# Patient Record
Sex: Male | Born: 1960 | Race: White | Hispanic: No | Marital: Married | Smoking: Current every day smoker
Health system: Southern US, Community
[De-identification: ages and names within clinical notes are randomized; demographics above are authoritative.]

## PROBLEM LIST (undated history)

## (undated) DIAGNOSIS — E079 Disorder of thyroid, unspecified: Secondary | ICD-10-CM

## (undated) DIAGNOSIS — M549 Dorsalgia, unspecified: Secondary | ICD-10-CM

## (undated) DIAGNOSIS — H52209 Unspecified astigmatism, unspecified eye: Secondary | ICD-10-CM

## (undated) DIAGNOSIS — E785 Hyperlipidemia, unspecified: Secondary | ICD-10-CM

## (undated) DIAGNOSIS — M1990 Unspecified osteoarthritis, unspecified site: Secondary | ICD-10-CM

## (undated) DIAGNOSIS — I10 Essential (primary) hypertension: Secondary | ICD-10-CM

## (undated) DIAGNOSIS — F419 Anxiety disorder, unspecified: Secondary | ICD-10-CM

## (undated) HISTORY — DX: Unspecified astigmatism, unspecified eye: H52.209

## (undated) HISTORY — DX: Unspecified osteoarthritis, unspecified site: M19.90

## (undated) HISTORY — PX: PLEURAL SCARIFICATION: SHX748

## (undated) HISTORY — PX: BACK SURGERY: SHX140

## (undated) HISTORY — PX: APPENDECTOMY: SHX54

## (undated) HISTORY — DX: Hyperlipidemia, unspecified: E78.5

---

## 2000-02-03 ENCOUNTER — Emergency Department (HOSPITAL_COMMUNITY): Admission: EM | Admit: 2000-02-03 | Discharge: 2000-02-03 | Payer: Self-pay | Admitting: Emergency Medicine

## 2001-11-28 ENCOUNTER — Encounter: Payer: Self-pay | Admitting: Neurosurgery

## 2001-12-02 ENCOUNTER — Encounter: Payer: Self-pay | Admitting: Neurosurgery

## 2001-12-02 ENCOUNTER — Ambulatory Visit (HOSPITAL_COMMUNITY): Admission: RE | Admit: 2001-12-02 | Discharge: 2001-12-02 | Payer: Self-pay | Admitting: Neurosurgery

## 2005-03-17 ENCOUNTER — Inpatient Hospital Stay (HOSPITAL_COMMUNITY): Admission: EM | Admit: 2005-03-17 | Discharge: 2005-03-22 | Payer: Self-pay | Admitting: Emergency Medicine

## 2005-04-05 ENCOUNTER — Encounter (INDEPENDENT_AMBULATORY_CARE_PROVIDER_SITE_OTHER): Payer: Self-pay | Admitting: Specialist

## 2005-04-06 ENCOUNTER — Inpatient Hospital Stay (HOSPITAL_COMMUNITY): Admission: EM | Admit: 2005-04-06 | Discharge: 2005-04-09 | Payer: Self-pay | Admitting: Emergency Medicine

## 2006-05-07 ENCOUNTER — Inpatient Hospital Stay (HOSPITAL_COMMUNITY): Admission: RE | Admit: 2006-05-07 | Discharge: 2006-05-07 | Payer: Self-pay | Admitting: Neurosurgery

## 2006-11-29 ENCOUNTER — Encounter: Admission: RE | Admit: 2006-11-29 | Discharge: 2006-11-29 | Payer: Self-pay | Admitting: Neurosurgery

## 2007-02-07 IMAGING — CT CT ABDOMEN W/ CM
2 of 5 series · 17 of 46 positions shown, 19 images · IV contrast (omnipaque)
Comparison: 03/17/2005

ABDOMEN CT WITH CONTRAST

CLINICAL DATA: Appendicitis. Status post abscess drain.
TECHNIQUE: Multidetector CT imaging of the abdomen and pelvis was performed
following the standard protocol during bolus administration of intravenous
contrast.

Contrast:  125 cc Omnipaque 300

[Series 2: abd_pel 5.0 b40f st · axial · 0.80mm/px · z∈[-529,-59]mm · 14 of 106 slices shown, 16 images]
[im 6/106  soft-tissue]
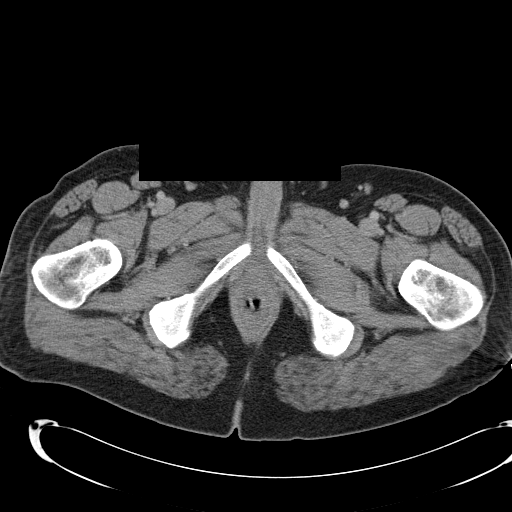
[im 6/106  bone]
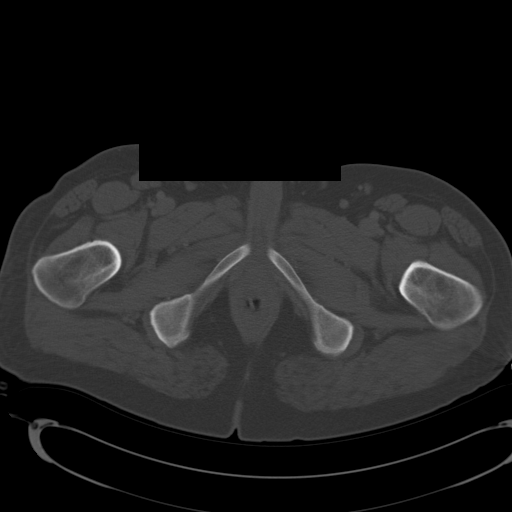
[im 12/106  soft-tissue]
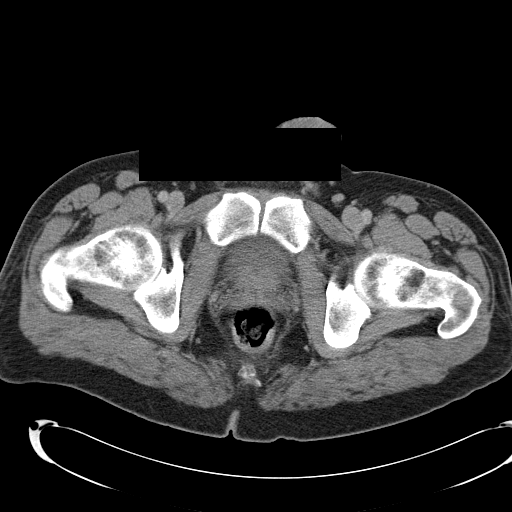
[im 23/106  soft-tissue]
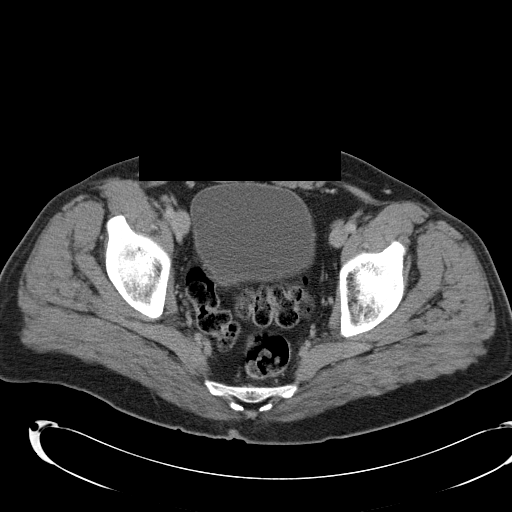
[im 28/106  soft-tissue]
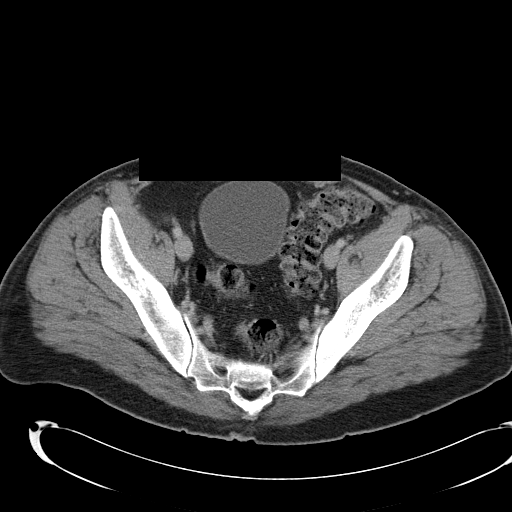
[im 34/106  soft-tissue]
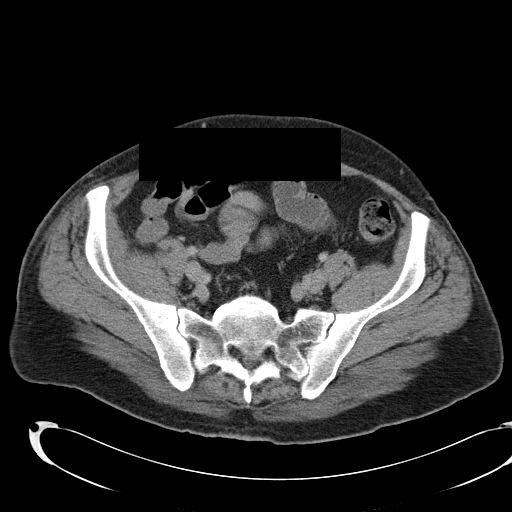
[im 45/106  soft-tissue]
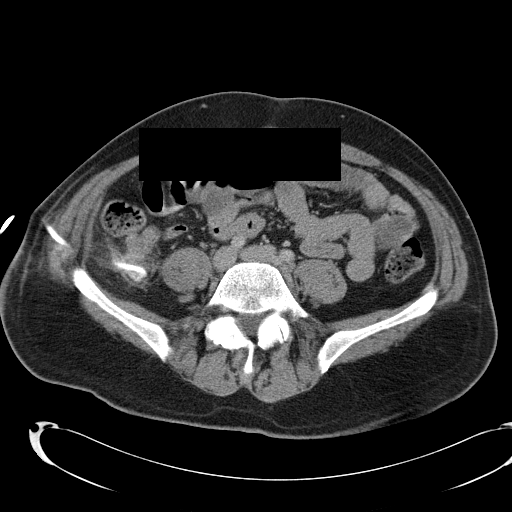
[im 50/106  soft-tissue]
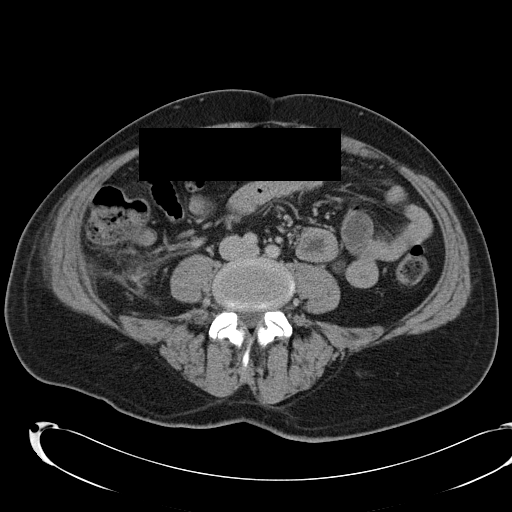
[im 56/106  soft-tissue]
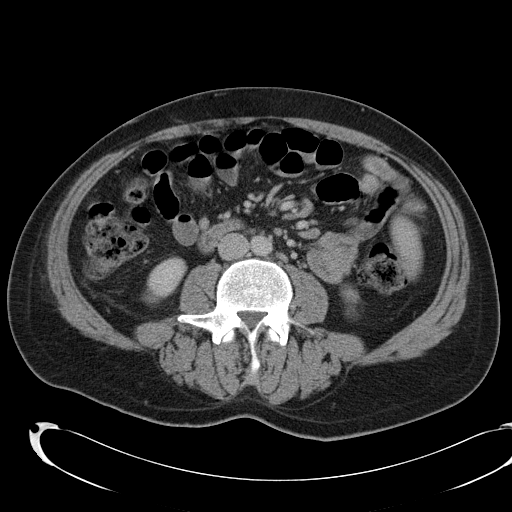
[im 61/106  soft-tissue]
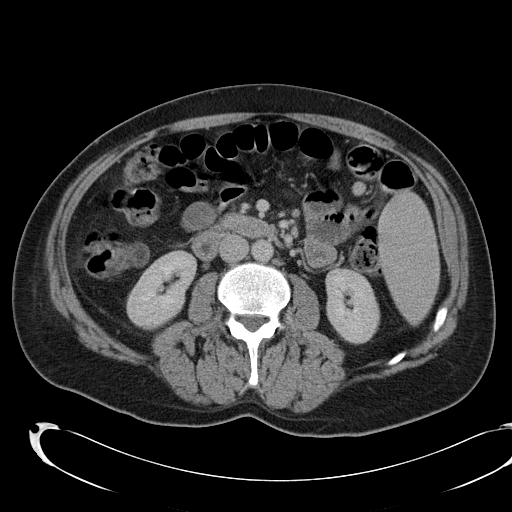
[im 61/106  bone]
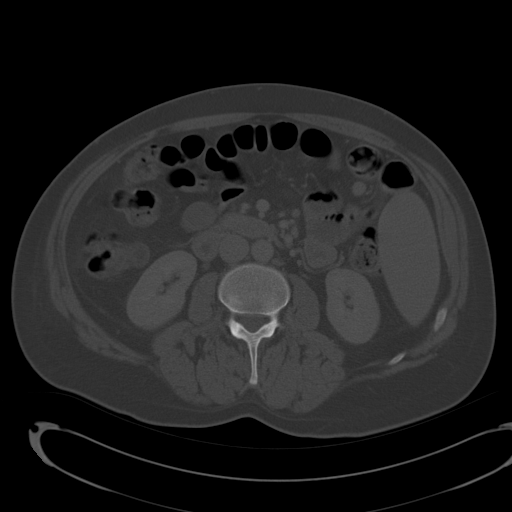
[im 72/106  soft-tissue]
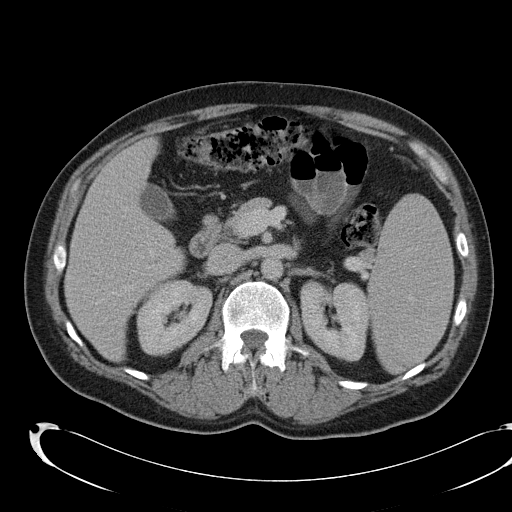
[im 78/106  soft-tissue]
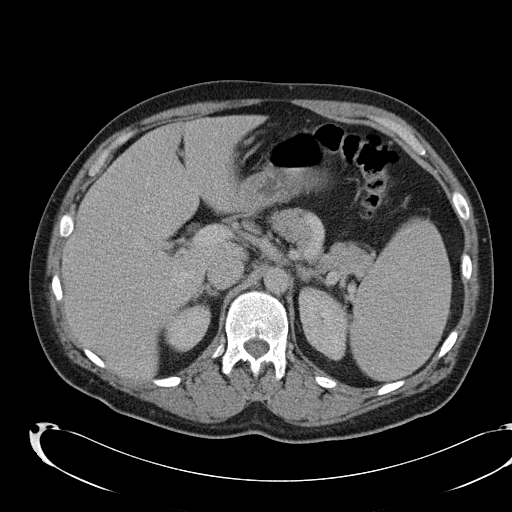
[im 83/106  soft-tissue]
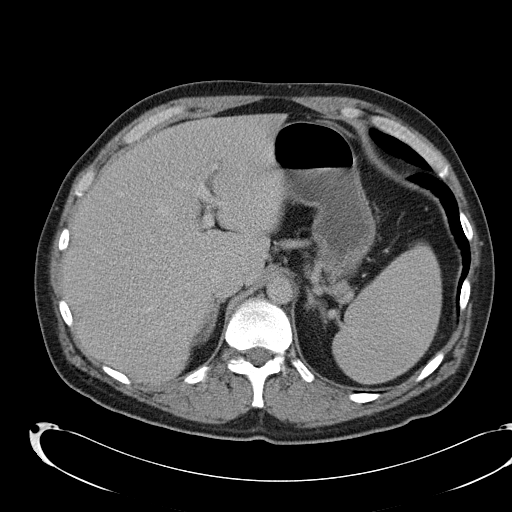
[im 94/106  soft-tissue]
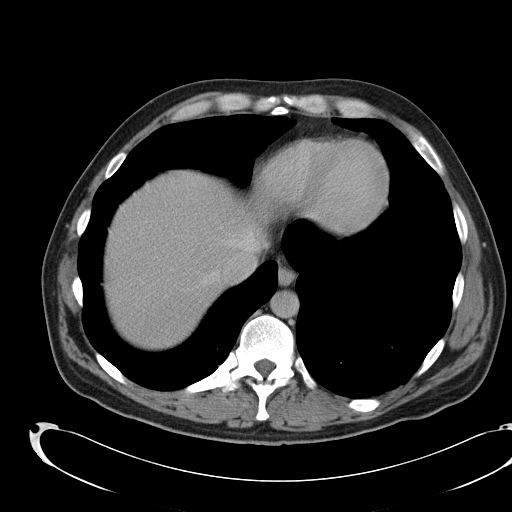
[im 100/106  soft-tissue]
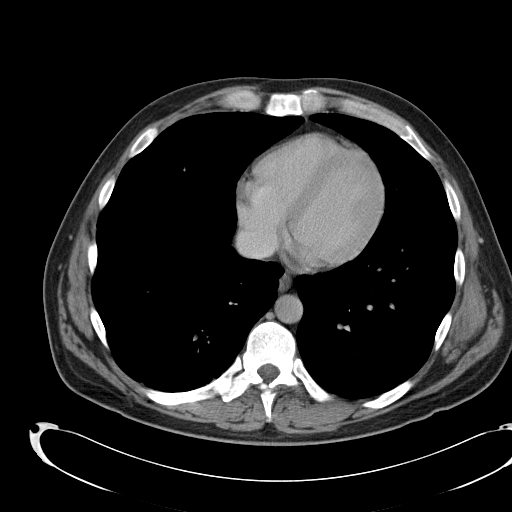

[Series 602: coronal · coronal · 1.06mm/px · 3 of 59 slices shown]
[im 20/59  soft-tissue]
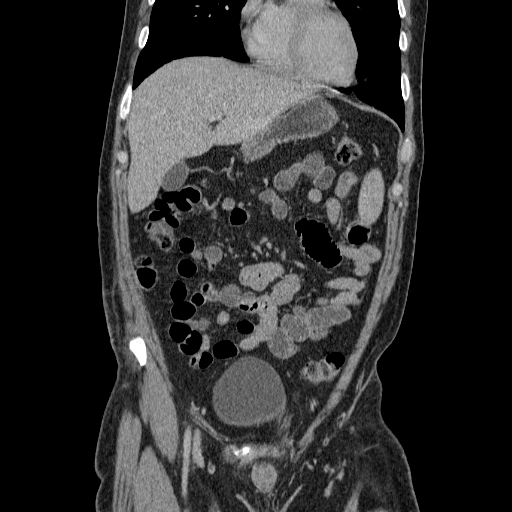
[im 26/59  soft-tissue]
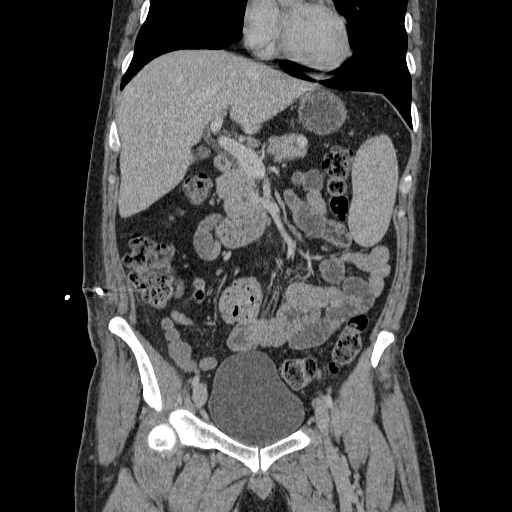
[im 33/59  soft-tissue]
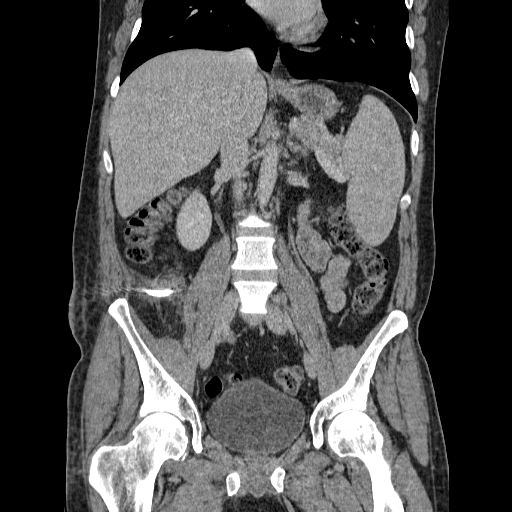

[17 of 46 positions shown; findings below may reference images not displayed]

FINDINGS: The liver, pancreas, adrenals, kidneys unremarkable. Spleen is
enlarged, measuring nearly 18 cm in craniocaudal length. This is stable since
prior study. Bowel grossly unremarkable. Minimal right base atelectasis.
Otherwise lung bases clear.

IMPRESSION

Splenomegaly

PELVIS CT WITH CONTRAST
FINDINGS: Right lower quadrant drain remains in place. There is essentially
complete resolution of the right lower quadrant abscess with no identifiable
fluid remaining.

Bowel grossly unremarkable. No free fluid, free air, or adenopathy.

IMPRESSION

Complete resolution of the right lower quadrant abscess with right lower
quadrant drain remaining in place.

## 2009-07-10 ENCOUNTER — Emergency Department (HOSPITAL_COMMUNITY): Admission: EM | Admit: 2009-07-10 | Discharge: 2009-07-10 | Payer: Self-pay | Admitting: Emergency Medicine

## 2010-07-01 NOTE — H&P (Signed)
NAMEKAICEN, DESENA                ACCOUNT NO.:  [id number]   MEDICAL RECORD NO.:  [id number]          PATIENT TYPE:  EMS   LOCATION:  ED                           FACILITY:  [facility]   PHYSICIAN:  Alfonse Ras, MD   DATE OF BIRTH:  10/11/1960   DATE OF ADMISSION:  03/17/2005  DATE OF DISCHARGE:                                HISTORY & PHYSICAL   A 50 year old white male presented to the emergency room with a one week  history of abdominal pain. Workup here by CAT scan revealed a perforated  appendix with a 4 x 5 cm abscess in the right lower quadrant consistent with  perforated appendicitis.   PAST MEDICAL HISTORY:  None.   PAST SURGICAL HISTORY:  Significant for back surgery only.   MEDICATIONS:  None.   ALLERGIES:  He has no known drug allergies.   PHYSICAL EXAMINATION:  GENERAL:  He is an age appropriate white male in  minimal distress.  VITAL SIGNS:  Temperature is 99.6, blood pressure is 127/65, respiratory  rate is 16.  HEENT:  Benign, normocephalic, atraumatic. Pupils equal round and reactive  to light.  LUNGS:  Clear to auscultation and percussion x2.  HEART:  Regular rate and rhythm without murmurs, rubs or gallops.  ABDOMEN:  Soft but tender with localized rebound in the right lower  quadrant.  EXTREMITIES:  Show no cyanosis, clubbing or edema.   IMPRESSION:  Perforated appendicitis.   PLAN:  Admission, IV antibiotics and percutaneous drainage of the perforated  appendix.      Alfonse Ras, MD  Electronically Signed     KRE/MEDQ  D:  03/17/2005  T:  03/17/2005  Job:  [id number]

## 2010-07-01 NOTE — Discharge Summary (Signed)
[name], [name]                ACCOUNT NO.:  [id number]   MEDICAL RECORD NO.:  [id number]          PATIENT TYPE:  INP   LOCATION:  1519                         FACILITY:  [facility]   PHYSICIAN:  Alfonse Ras, MD   DATE OF BIRTH:  07-03-1960   DATE OF ADMISSION:  03/17/2005  DATE OF DISCHARGE:  03/22/2005                                 DISCHARGE SUMMARY   ADMISSION DIAGNOSIS:  Ruptured appendicitis.   DISCHARGE DIAGNOSES:  Ruptured appendicitis.   CONDITION ON DISCHARGE:  Good and improved.   DISPOSITION:  Patient is discharged to home.   FOLLOW UP:  With me in one week.   DISCHARGE MEDICATIONS:  Flagyl 500 mg b.i.d. x7 days and Cipro 500 mg b.i.d.  x7 days.   HOSPITAL COURSE:  Patient was admitted and on CAT scan was found to have a  perforated appendicitis with a localized abscess.  He was started on IV  antibiotics and had a percutaneous drain placed.  This was left in place and  flushed twice a day.  Patient  remained on IV Unasyn for four days.  He  became afebrile after the first 36 hours, was tolerating a regular diet, and  on repeat CAT scan on hospital day #4, the size of the abscess was  decreased, and the drain was removed.  The patient was ready for discharge  and will be scheduled for followup with me in three weeks.   PLAN:  Follow up with me in three weeks and schedule for interval  appendectomy.      Alfonse Ras, MD  Electronically Signed     KRE/MEDQ  D:  03/22/2005  T:  03/22/2005  Job:  [phone] [id number]

## 2010-07-01 NOTE — Op Note (Signed)
NAMEDANH, BAYUS                ACCOUNT NO.:  [id number]   MEDICAL RECORD NO.:  [id number]          PATIENT TYPE:  INP   LOCATION:  3012                         FACILITY:  [facility]   PHYSICIAN:  Hewitt Shorts, M.D.DATE OF BIRTH:  1960-07-15   DATE OF PROCEDURE:  05/07/2006  DATE OF DISCHARGE:                               OPERATIVE REPORT   PREOPERATIVE DIAGNOSES:  1. Recurrent left L5-S1 lumbar disk herniation.  2. Lumbar spondylosis.  3. Lumbar degenerative disease.  4. Lumbar radiculopathy.   POSTOPERATIVE DIAGNOSES:  1. Recurrent left L5-S1 lumbar disk herniation.  2. Lumbar spondylosis.  3. Lumbar degenerative disease.  4. Lumbar radiculopathy.   PROCEDURE:  Left L5-S1 lumbar laminotomy and microdiskectomy with  microdissection.   SURGEON:  Hewitt Shorts, M.D.   ASSISTANT:  Webb Silversmith, RN and Venetia Maxon.   ANESTHESIA:  General endotracheal anesthesia.   INDICATIONS:  The patient is a 50 year old man who presented with  recurrent left lumbar radiculopathy.  He is here status post a previous  left L5-S1 lumbar diskectomy from which he did well.  Decision was made  to proceed with elective laminotomy and microdiskectomy.   PROCEDURE:  The patient was brought to the operating room, placed under  general endotracheal anesthesia.  The patient was turned to a prone  position.  The lumbar region was prepped with Betadine saline solution  and draped in sterile fashion.  The previous midline incision was  reopened.  The incision was infiltrated with local anesthetic with  epinephrine.  Dissection was carried down to the subcutaneous tissue to  the lumbar fascia, which was incised from the left side of the midline  and the paraspinal muscles were dissected off the spinous process and  lamina in a subperiosteal fashion.  The left L5-S1 interlaminar space  was identified and x-ray was taken to confirm the localization.  Then  the microscope was draped and brought into the field  to provide  additional magnification, illumination and visualization and the  remainder of the decompression was performed using microdissection,  microsurgical technique.  The scar tissue was dissected from the edge of  the previous laminotomy and the laminotomy was extended slightly.  We  found what appeared to be a small to moderate sized synovial cyst  emanating from the left L5-S1 facet joint.  This was carefully dissected  free from the thecal sac and left S1 nerve root and removed.  We then  further dissected in the epidural space and then were able to gently  mobilize the left S1 nerve root and thecal sac medially.  This exposed a  free fragment disk herniation.  This was freed up from the surrounding  epidural tissues and removed in a piecemeal fashion and then we incised  the remaining scar tissue overlying the anulus, incised the anulus and  then entered into the disk space and proceeding with the third  diskectomy using a variety of pituitary rongeurs and micro curets.  All  the loose disk material was removed from both the disk space and the  epidural space and good decompression of thecal sac and  nerve root were  achieved.  Once the diskectomy was completed and hemostasis was  subsequently established with the use of bipolar cautery the wound was  irrigated with bacitracin solution and then we instilled 2 mL of  fentanyl, 80 mg Depo-Medrol into the epidural space and proceeded with  closure.  The deep fascia was closed with interrupted undyed 1 Vicryl.  The Scarpa's fascia was closed with interrupted, inverted undyed 1  Vicryl suture and the subcutaneous subcuticular was closed interrupted  inverted 2-0 undyed Vicryl sutures and the skin edges were approximated  with Dermabond.  The procedure was tolerated well.  Estimated blood loss  was 25 mL.  Sponge count correct.  Following surgery the patient was  turned back to the supine position to be reversed extubated and   transferred to the recovery room for further care.      Hewitt Shorts, M.D.  Electronically Signed     RWN/MEDQ  D:  05/07/2006  T:  05/07/2006  Job:  [id number]

## 2010-07-01 NOTE — Discharge Summary (Signed)
NAMETENG, DECOU                ACCOUNT NO.:  [id number]   MEDICAL RECORD NO.:  [id number]          PATIENT TYPE:  INP   LOCATION:                               FACILITY:  [facility]   PHYSICIAN:  Alfonse Ras, MD   DATE OF BIRTH:  1960-03-11   DATE OF ADMISSION:  04/09/2005  DATE OF DISCHARGE:  04/14/2005                                 DISCHARGE SUMMARY   ADMISSION DIAGNOSIS:  Recurrent acute appendicitis.   DISCHARGE DIAGNOSIS:  Recurrent acute appendicitis.   PROCEDURE:  Laparoscopic appendectomy.   CONDITION ON DISCHARGE:  Good and improved.   FOLLOWUP:  Followup is with me in 2 weeks.   Disposition is discharged to home.   DISPOSITION:  Discharged to home.   HOSPITAL COURSE:  The patient was admitted with recurrent appendicitis and  known to have been ruptured approximately 3 weeks prior.  He was treated at  that time with percutaneous drainage but now presents with worsening  symptoms.  He was taken to the operating room and underwent laparoscopic  appendectomy and was maintained on antibiotics.  His ileus took 2-3 days to  resolve.  His temperature came down to 99.  White count remained stable  at  11,000.  By postoperative #4, he was eating a regular diet, had no nausea or  vomiting. Incision was okay, and he was discharged to home.  Followup was  with me in 2 weeks.      Alfonse Ras, MD  Electronically Signed     KRE/MEDQ  D:  04/18/2005  T:  04/19/2005  Job:  [id number]

## 2010-07-01 NOTE — Op Note (Signed)
[name], [name]                ACCOUNT NO.:  [id number]   MEDICAL RECORD NO.:  [id number]          PATIENT TYPE:  INP   LOCATION:  1610                         [facility]ACILITY:  [facility]   PHYSICIAN:  Alfonse Ras, MD   DATE OF BIRTH:  Jul 05, 1960   DATE OF PROCEDURE:  04/05/2005  DATE OF DISCHARGE:                                 OPERATIVE REPORT   PREOPERATIVE DIAGNOSIS:  Ruptured appendicitis.   POSTOPERATIVE DIAGNOSIS:  Ruptured appendicitis.   PROCEDURE:  Laparoscopic appendectomy.   SURGEON:  Danna Hefty, MD.   ANESTHESIA:  General.   DESCRIPTION OF PROCEDURE:  The patient was taken to the operating room,  placed in a supine position. After adequate general anesthesia was induced,  a Foley catheter was placed and the abdomen was prepped and draped in a  normal sterile fashion. Using a 12 mm Optiview trocar in the right upper  quadrant under direct vision, peritoneal access was obtained.  Pneumoperitoneum was obtained. Additional 12 and 5 mm trocars were placed in  the abdomen. The right lower quadrant was inspected and the cecum was  inspected. The appendix was acutely inflamed and there was an area of  purulence consistent with his previous perforation. After significant  tedious dissection to mobilize the appendix off the right lateral sidewall  of the peritoneum it was delivered up anteriorly. The base of the appendix  was inspected and its junction with the cecum was inspected. At this point,  I used a white load GIA stapling device to transect the base of the appendix  at the cecum. Because of the extensive amount of contamination from the  previous perforation, I opted to place a drain so a 19 Blake drain was  placed through the 5 mm trocar and into the right lower quadrant near the  anastomosis. The staple line was reinforced with Tisseel. The right lower  quadrant was copiously irrigated with 3 liters of saline. The  pneumoperitoneum was then released after the  appendix and placed in the  EndoCatch bag and removed through the 12 mm port. Skin incisions were closed  with subcuticular 4-0 Monocryl. Steri-Strips and sterile dressings were  applied. The patient tolerated the procedure well and went to PACU in good  condition.      Alfonse Ras, MD  Electronically Signed     KRE/MEDQ  D:  04/05/2005  T:  04/07/2005  Job:  [id number]

## 2010-07-01 NOTE — Op Note (Signed)
NAME:  [name], [name]                          ACCOUNT NO.:  [id number]   MEDICAL RECORD NO.:  [id number]                   PATIENT TYPE:  OIB   LOCATION:  NA                                   FACILITY:  [facility]   PHYSICIAN:  Hewitt Shorts, M.D.            DATE OF BIRTH:  October 09, 1960   DATE OF PROCEDURE:  12/02/2001  DATE OF DISCHARGE:                                 OPERATIVE REPORT   PREOPERATIVE DIAGNOSIS:  Left L5-S1 lumbar disc herniation.   POSTOPERATIVE DIAGNOSIS:  Left L5-S1 lumbar disc herniation.   OPERATION:  Left L5-S1 lumbar laminectomy and microdiskectomy.   SURGEON:  Hewitt Shorts, M.D.   ASSISTANT:  Hilda Lias, M.D.   ANESTHESIA:  General endotracheal.   INDICATIONS FOR PROCEDURE:  The patient is a 50 year old man who presented  with left lumbar radiculopathy,  was found on an MRI scan to have left 5-S1  disc herniation and decision was made to proceed with elective laminectomy  and microdissection.   DESCRIPTION OF PROCEDURE:  The patient was brought to the operating room and  placed under general endotracheal anesthesia.  The patient was turned to a  prone position and lumbar region was prepped with Betadine soap and solution  and draped as a sterile fashion.  The midline was infiltrated with local  anesthetic with epinephrine and x-ray was taken.  The L5-S1 level was  identified and the midline incision made, carried down through the  subcutaneous tissue.  Bipolar cautery and electrocautery was used to  maintain hemostasis.  Dissection was carried down to the lumbar fascia which  was incised on the left side of the midline and the paraspinous muscle and  dissection of spinous process and lamina in a subperiosteal fashion.  The L5-  S1 intralaminar space was identified and x-ray was taken to confirm the  localization and then a laminotomy was performed using a Black Max drill and  Kerrison punches.  The microscope was draped and brought into the  field to  provide instant magnification, illumination and visualization and the  remainder of the procedure was performed using microdissection and  microsurgical technique.  Ligamentum flavum was carefully resected and the  underlying thecal sac and left S1 nerve root were identified.  These were  gently retracted medially.  Overlying epidural veins were coagulated and  disc herniation identified.  The remaining annular fibers were incised and  disc fragment removed. We then further entered into the disc space and  removed degenerated disc material.  In the end, all loose fragments of disc  material were removed from both the disc space and the epidural space and  good decompression of the thecal sac and nerve root was achieved.  Hemostasis was established with the use of bipolar cautery and then once  hemostasis was established, we infiltrated 2 cc of Fentanyl and 80 mg of  Depo-Medrol into the epidural space and proceeded  with closure.  The deep  fascia was closed with interrupted undyed 0 Vicryl suture.  The subcutaneous  layers was closed with interrupted inverted undyed 0 Vicryl sutures and the  subcutaneous and subcuticular closed interrupted inverted  2-0 Vicryl sutures.  Skin edges were approximated with Dermabond.  The  patient tolerated the procedure well.  Estimated blood loss was 25 cc.  Sponge and needle count were correct.  Following surgery, the patient was  returned back to a supine position, reversed anesthetic, extubated and  transferred to the recovery room for further care.                                               Hewitt Shorts, M.D.    RWN/MEDQ  D:  12/02/2001  T:  12/02/2001  Job:  [id number]

## 2010-07-01 NOTE — H&P (Signed)
[name], [name]                ACCOUNT NO.:  [id number]   MEDICAL RECORD NO.:  [id number]          PATIENT TYPE:  OBV   LOCATION:  0106                         FACILITY:  [facility]   PHYSICIAN:  Lebron Conners, M.D.   DATE OF BIRTH:  Sep 19, 1960   DATE OF ADMISSION:  04/04/2005  DATE OF DISCHARGE:                                HISTORY & PHYSICAL   CHIEF COMPLAINT:  Abdominal pain.   PRESENT ILLNESS:  The patient is a 50 year old white male who at the first  part of this month was hospitalized and treated by percutaneous drainage of  a periappendiceal abscess with intention of delaying appendectomy for about  six weeks after treatment of the abscess.  He was cared for by Dr. Colin Benton.  He recently has had flu-like symptoms with a little diarrhea and vomiting.  That has a abated but he has now developed recurring right lower quadrant  pain.  He had a temperature of about 101 yesterday.  He came to the  emergency department last night and a CT scan shows some evidence of  continued periappendiceal inflammation with a small amount of fluid present  but no sizable abscess.  There is no evidence of free peritoneal fluid or  other signs of peritonitis.  His white count is 11.6.  He is admitted for  pain control, IV antibiotics, and consideration of appendectomy at this  time.   PAST MEDICAL HISTORY:  He enjoys good health.  He has no serious chronic  ailments.   He has no chronic medicines.   No medicine allergies.   He smokes about a pack of cigarettes a day.  He drinks alcoholic beverages  only rarely.   REVIEW OF SYSTEMS:  In detail is unremarkable.   FAMILY HISTORY AND CHILDHOOD ILLNESSES:  Unremarkable.   PHYSICAL EXAMINATION:  VITAL SIGNS:  Temperature and vital signs as recorded  by nursing staff.  MENTAL STATUS:  Normal.  No acute distress.  The patient gets up and stands  easily.  HEENT:  Unremarkable with no thyroid enlargement.  No thyroid mass.  No neck  mass.  The  mucous membranes moist.  No icterus.  CHEST:  Clear to auscultation and no chest wall tenderness.  HEAT:  Rate and rhythm normal.  No murmur or gallop.  ABDOMEN:  Soft with good bowel sounds.  No hernia.  No mass.  Marked extreme  right lower quadrant tenderness but no rebound present.  EXTREMITIES:  Normal.  LYMPH NODES:  Not enlarged.  SKIN:  No lesions noted.  NEUROLOGICAL:  Grossly normal.   IMPRESSION:  Continued pain due to appendicitis without evidence of  perforation or abscess at this time.   PLAN:  Fairly urgent appendectomy due to continued symptoms.  I will arrange  to do this today or tomorrow or ask my associate Dr. Daphine Deutscher,  who is on call  today, to do the procedure.  The patient agrees to have this done.      Lebron Conners, M.D.  Electronically Signed     WB/MEDQ  D:  04/05/2005  T:  04/05/2005  Job:  [id number] 

## 2011-11-04 ENCOUNTER — Emergency Department (HOSPITAL_COMMUNITY)
Admission: EM | Admit: 2011-11-04 | Discharge: 2011-11-04 | Disposition: A | Discharge status: Home / Self Care | Payer: Self-pay | Attending: Emergency Medicine | Admitting: Emergency Medicine

## 2011-11-04 ENCOUNTER — Emergency Department (HOSPITAL_COMMUNITY): Payer: Self-pay

## 2011-11-04 ENCOUNTER — Encounter (HOSPITAL_COMMUNITY): Payer: Self-pay | Admitting: *Deleted

## 2011-11-04 DIAGNOSIS — F411 Generalized anxiety disorder: Secondary | ICD-10-CM | POA: Insufficient documentation

## 2011-11-04 DIAGNOSIS — M542 Cervicalgia: Secondary | ICD-10-CM | POA: Insufficient documentation

## 2011-11-04 DIAGNOSIS — F17200 Nicotine dependence, unspecified, uncomplicated: Secondary | ICD-10-CM | POA: Insufficient documentation

## 2011-11-04 DIAGNOSIS — I10 Essential (primary) hypertension: Secondary | ICD-10-CM | POA: Insufficient documentation

## 2011-11-04 HISTORY — DX: Disorder of thyroid, unspecified: E07.9

## 2011-11-04 HISTORY — DX: Dorsalgia, unspecified: M54.9

## 2011-11-04 HISTORY — DX: Anxiety disorder, unspecified: F41.9

## 2011-11-04 HISTORY — DX: Essential (primary) hypertension: I10
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
## 2011-11-08 NOTE — ED Provider Notes (Signed)
Medical screening examination/treatment/procedure(s) were performed by non-physician practitioner and as supervising physician I was immediately available for consultation/collaboration.   Somers Y. Jacorian Golaszewski, MD 11/08/11 1346 

## 2013-07-19 ENCOUNTER — Emergency Department (HOSPITAL_COMMUNITY): Payer: Self-pay

## 2013-07-19 ENCOUNTER — Encounter (HOSPITAL_COMMUNITY): Payer: Self-pay | Admitting: Emergency Medicine

## 2013-07-19 ENCOUNTER — Emergency Department (HOSPITAL_COMMUNITY)
Admission: EM | Admit: 2013-07-19 | Discharge: 2013-07-19 | Disposition: A | Discharge status: Home / Self Care | Payer: Self-pay | Attending: Emergency Medicine | Admitting: Emergency Medicine

## 2013-07-19 DIAGNOSIS — Z79899 Other long term (current) drug therapy: Secondary | ICD-10-CM | POA: Insufficient documentation

## 2013-07-19 DIAGNOSIS — Z8639 Personal history of other endocrine, nutritional and metabolic disease: Secondary | ICD-10-CM | POA: Insufficient documentation

## 2013-07-19 DIAGNOSIS — M25569 Pain in unspecified knee: Secondary | ICD-10-CM | POA: Insufficient documentation

## 2013-07-19 DIAGNOSIS — I10 Essential (primary) hypertension: Secondary | ICD-10-CM | POA: Insufficient documentation

## 2013-07-19 DIAGNOSIS — Z862 Personal history of diseases of the blood and blood-forming organs and certain disorders involving the immune mechanism: Secondary | ICD-10-CM | POA: Insufficient documentation

## 2013-07-19 DIAGNOSIS — M25562 Pain in left knee: Secondary | ICD-10-CM

## 2013-07-19 DIAGNOSIS — Z8659 Personal history of other mental and behavioral disorders: Secondary | ICD-10-CM | POA: Insufficient documentation

## 2013-07-19 DIAGNOSIS — F17200 Nicotine dependence, unspecified, uncomplicated: Secondary | ICD-10-CM | POA: Insufficient documentation

## 2013-07-19 MED ORDER — HYDROCODONE-ACETAMINOPHEN 5-325 MG PO TABS
1.0000 | ORAL_TABLET | Freq: Once | ORAL | Status: AC
  Administered 2013-07-19: 1 via ORAL
  Filled 2013-07-19: qty 1

## 2013-07-19 MED ORDER — HYDROCODONE-ACETAMINOPHEN 5-325 MG PO TABS: ORAL_TABLET | ORAL | Status: DC

## 2013-07-19 NOTE — ED Notes (Signed)
Pt reports increased pain and tenderness in l/knee x 1 week. Denies trauma. Pt alert and appropriate at present

## 2013-07-19 NOTE — ED Provider Notes (Signed)
CSN: [id number]     Arrival date & time 07/19/13  1718 History  This chart was scribed for Nicholas Blitz, PA-C, working with Babette Relic, MD by Maree Erie, ED Scribe. This patient was seen in room WTR6/WTR6 and the patient's care was started at 5:28 PM.   Chief Complaint  Patient presents with  . Knee Pain    l/knee pain x 1 week    The history is provided by the patient. No language interpreter was used.    HPI Comments: [name] is a 53 y.o. male who presents to the Emergency Department complaining of constant left knee pain that began about a week ago. He denies known injury or trauma to the area. He rates the pain as a 7-8/10 in severity currently, but states it has been severe enough to bring him to tears once. The pain is worsened by standing for prolonged periods and then bending it. He has been taking Goody's at home with mild relief.    Past Medical History  Diagnosis Date  . Back pain   . Thyroid disease   . Hypertension   . Anxiety    Past Surgical History  Procedure Laterality Date  . Back surgery    . Appendectomy    . Pleural scarification     No family history on file. History  Substance Use Topics  . Smoking status: Current Every Day Smoker  . Smokeless tobacco: Not on file  . Alcohol Use: Yes     Comment: very rare    Review of Systems A complete 10 system review of systems was obtained and all systems are negative except as noted in the HPI and PMH.    Allergies  Review of patient's allergies indicates no known allergies.  Home Medications   Prior to Admission medications   Medication Sig Start Date End Date Taking? Authorizing Provider  HYDROcodone-acetaminophen (NORCO/VICODIN) 5-325 MG per tablet Take 1-2 tablets by mouth every 6 hours as needed for pain. 07/19/13   Malu Pellegrini, PA-C  lisinopril-hydrochlorothiazide (PRINZIDE,ZESTORETIC) 20-25 MG per tablet Take 0.5 tablets by mouth daily.    Historical Provider, MD   Triage  Vitals: BP 134/88  Pulse 80  Resp 18  SpO2 97%  Physical Exam  Nursing note and vitals reviewed. Constitutional: He is oriented to person, place, and time. He appears well-developed and well-nourished. No distress.  HENT:  Head: Normocephalic and atraumatic.  Eyes: Conjunctivae and EOM are normal.  Neck: Normal range of motion. No tracheal deviation present.  Cardiovascular: Normal rate.   Pulmonary/Chest: Effort normal. No respiratory distress.  Musculoskeletal: Normal range of motion.  Left knee:  No deformity, erythema or abrasions. FROM. No effusion. ++crepitance. Anterior and posterior drawer show no abnormal laxity. Stable to valgus and varus stress. Joint lines are non-tender. Neurovascularly intact. Pt ambulates with a mildly antalgic gait.    Neurological: He is alert and oriented to person, place, and time.  Skin: Skin is warm and dry.  Psychiatric: He has a normal mood and affect. His behavior is normal.    ED Course  Procedures (including critical care time)  DIAGNOSTIC STUDIES: Oxygen Saturation is 97% on room air, adequate by my interpretation.    COORDINATION OF CARE: 5:30 PM -Will place left knee in immobilizer. Patient verbalizes understanding and agrees with treatment plan.    Labs Review Labs Reviewed - No data to display  Imaging Review Dg Knee Complete 4 Views Left  07/19/2013   CLINICAL DATA:  One week history of anterior knee pain without known injury.  EXAM: LEFT KNEE - COMPLETE 4+ VIEW  COMPARISON:  None.  FINDINGS: The bones are adequately mineralized. There is no acute or healing fracture. There is mild beaking of the tibial spines. The joint spaces are reasonably well maintained. There is no joint effusion. The soft tissues exhibit no abnormal densities.  IMPRESSION: There are very mild degenerative changes of the left knee. There is no acute bony abnormality.   Electronically Signed   By: David  Martinique   On: 07/19/2013 18:45     EKG  Interpretation None      MDM   Final diagnoses:  Pain in joint of left knee    Filed Vitals:   07/19/13 1730 07/19/13 1734 07/19/13 1907  BP: 134/88    Pulse: 80  73  Temp:  98.4 F (36.9 C)   TempSrc:  Oral   Resp: 18    SpO2: 97%  96%    Medications  HYDROcodone-acetaminophen (NORCO/VICODIN) 5-325 MG per tablet 1 tablet (1 tablet Oral Given 07/19/13 1907)    [name] is a 53 y.o. male presenting with atraumatic left knee pain. Taking Goody powder home with little relief. X-ray shows DJD. Patient will be placed in knee immobilizer, given crutches and asked to follow closely with orthopedics.  Evaluation does not show pathology that would require ongoing emergent intervention or inpatient treatment. Pt is hemodynamically stable and mentating appropriately. Discussed findings and plan with patient/guardian, who agrees with care plan. All questions answered. Return precautions discussed and outpatient follow up given.   Discharge Medication List as of 07/19/2013  6:58 PM    START taking these medications   Details  HYDROcodone-acetaminophen (NORCO/VICODIN) 5-325 MG per tablet Take 1-2 tablets by mouth every 6 hours as needed for pain., Print        I personally performed the services described in this documentation, which was scribed in my presence. The recorded information has been reviewed and is accurate.    Nicholas Blitz, PA-C 07/19/13 2050

## 2013-07-19 NOTE — Discharge Instructions (Signed)
For pain control please take ibuprofen (also known as Motrin or Advil) 800mg  (this is normally 4 over the counter pills) 3 times a day  for 5 days. Take with food to minimize stomach irritation.  Take vicodin for breakthrough pain, do not drink alcohol, drive, care for children or do other critical tasks while taking vicodin.  Please follow with your primary care doctor in the next 2 days for a check-up. They must obtain records for further management.   Do not hesitate to return to the Emergency Department for any new, worsening or concerning symptoms.    Arthralgia Your caregiver has diagnosed you as suffering from an arthralgia. Arthralgia means there is pain in a joint. This can come from many reasons including:  Bruising the joint which causes soreness (inflammation) in the joint.  Wear and tear on the joints which occur as we grow older (osteoarthritis).  Overusing the joint.  Various forms of arthritis.  Infections of the joint. Regardless of the cause of pain in your joint, most of these different pains respond to anti-inflammatory drugs and rest. The exception to this is when a joint is infected, and these cases are treated with antibiotics, if it is a bacterial infection. HOME CARE INSTRUCTIONS   Rest the injured area for as long as directed by your caregiver. Then slowly start using the joint as directed by your caregiver and as the pain allows. Crutches as directed may be useful if the ankles, knees or hips are involved. If the knee was splinted or casted, continue use and care as directed. If an stretchy or elastic wrapping bandage has been applied today, it should be removed and re-applied every 3 to 4 hours. It should not be applied tightly, but firmly enough to keep swelling down. Watch toes and feet for swelling, bluish discoloration, coldness, numbness or excessive pain. If any of these problems (symptoms) occur, remove the ace bandage and re-apply more loosely. If these  symptoms persist, contact your caregiver or return to this location.  For the first 24 hours, keep the injured extremity elevated on pillows while lying down.  Apply ice for 15-20 minutes to the sore joint every couple hours while awake for the first half day. Then 03-04 times per day for the first 48 hours. Put the ice in a plastic bag and place a towel between the bag of ice and your skin.  Wear any splinting, casting, elastic bandage applications, or slings as instructed.  Only take over-the-counter or prescription medicines for pain, discomfort, or fever as directed by your caregiver. Do not use aspirin immediately after the injury unless instructed by your physician. Aspirin can cause increased bleeding and bruising of the tissues.  If you were given crutches, continue to use them as instructed and do not resume weight bearing on the sore joint until instructed. Persistent pain and inability to use the sore joint as directed for more than 2 to 3 days are warning signs indicating that you should see a caregiver for a follow-up visit as soon as possible. Initially, a hairline fracture (break in bone) may not be evident on X-rays. Persistent pain and swelling indicate that further evaluation, non-weight bearing or use of the joint (use of crutches or slings as instructed), or further X-rays are indicated. X-rays may sometimes not show a small fracture until a week or 10 days later. Make a follow-up appointment with your own caregiver or one to whom we have referred you. A radiologist (specialist in reading X-rays)  may read your X-rays. Make sure you know how you are to obtain your X-ray results. Do not assume everything is normal if you do not hear from [place]. SEEK MEDICAL CARE IF: Bruising, swelling, or pain increases. SEEK IMMEDIATE MEDICAL CARE IF:   Your fingers or toes are numb or blue.  The pain is not responding to medications and continues to stay the same or get worse.  The pain in your  joint becomes severe.  You develop a fever over 102 F (38.9 C).  It becomes impossible to move or use the joint. MAKE SURE YOU:   Understand these instructions.  Will watch your condition.  Will get help right away if you are not doing well or get worse. Document Released: 01/30/2005 Document Revised: 04/24/2011 Document Reviewed: 09/18/2007 [facility] Patient Information 2014 [place].

## 2013-07-20 NOTE — ED Provider Notes (Signed)
Medical screening examination/treatment/procedure(s) were performed by non-physician practitioner and as supervising physician I was immediately available for consultation/collaboration.   EKG Interpretation None       Babette Relic, MD 07/20/13 1435

## 2015-04-06 ENCOUNTER — Emergency Department (HOSPITAL_COMMUNITY): Payer: Self-pay

## 2015-04-06 ENCOUNTER — Emergency Department (HOSPITAL_COMMUNITY)
Admission: EM | Admit: 2015-04-06 | Discharge: 2015-04-06 | Disposition: A | Discharge status: Home / Self Care | Payer: Self-pay | Attending: Emergency Medicine | Admitting: Emergency Medicine

## 2015-04-06 ENCOUNTER — Encounter (HOSPITAL_COMMUNITY): Payer: Self-pay | Admitting: Emergency Medicine

## 2015-04-06 DIAGNOSIS — F17200 Nicotine dependence, unspecified, uncomplicated: Secondary | ICD-10-CM | POA: Insufficient documentation

## 2015-04-06 DIAGNOSIS — M79672 Pain in left foot: Secondary | ICD-10-CM | POA: Insufficient documentation

## 2015-04-06 DIAGNOSIS — R2242 Localized swelling, mass and lump, left lower limb: Secondary | ICD-10-CM | POA: Insufficient documentation

## 2015-04-06 DIAGNOSIS — Z79899 Other long term (current) drug therapy: Secondary | ICD-10-CM | POA: Insufficient documentation

## 2015-04-06 DIAGNOSIS — Z8639 Personal history of other endocrine, nutritional and metabolic disease: Secondary | ICD-10-CM | POA: Insufficient documentation

## 2015-04-06 DIAGNOSIS — I10 Essential (primary) hypertension: Secondary | ICD-10-CM | POA: Insufficient documentation

## 2015-04-06 DIAGNOSIS — Z8659 Personal history of other mental and behavioral disorders: Secondary | ICD-10-CM | POA: Insufficient documentation

## 2015-04-06 MED ORDER — SULFAMETHOXAZOLE-TRIMETHOPRIM 800-160 MG PO TABS: 1.0000 | ORAL_TABLET | Freq: Two times a day (BID) | ORAL | Status: AC

## 2015-04-06 MED ORDER — INDOMETHACIN 25 MG PO CAPS: 25.0000 mg | ORAL_CAPSULE | Freq: Three times a day (TID) | ORAL | Status: DC | PRN

## 2015-04-06 MED ORDER — OXYCODONE-ACETAMINOPHEN 5-325 MG PO TABS: 1.0000 | ORAL_TABLET | ORAL | Status: DC | PRN

## 2015-04-06 NOTE — ED Notes (Signed)
Bed: WA27 Expected date:  Expected time:  Means of arrival:  Comments: 

## 2015-04-06 NOTE — Discharge Instructions (Signed)
Take the prescribed medication as directed. Monitor foot for increasing redness or swelling.  Also monitor for fever. Follow-up with your primary care physician later this week for re-check. Return to the ED for new or worsening symptoms.

## 2015-04-06 NOTE — ED Notes (Addendum)
Per pt, states foot pain since last Friday-no injury-might have injured at work-increased pain-father has gout

## 2015-04-06 NOTE — ED Provider Notes (Signed)
CSN: [id number]     Arrival date & time 04/06/15  [id number] History   First MD Initiated Contact with Patient 04/06/15 1009     Chief Complaint  Patient presents with  . Foot Pain     (Consider location/radiation/quality/duration/timing/severity/associated sxs/prior Treatment) Patient is a 55 y.o. male presenting with lower extremity pain. The history is provided by the patient and medical records.  Foot Pain Associated symptoms include arthralgias.    55 year old male with history of back pain, thyroid disease, hypertension, anxiety, presenting to the ED for left foot pain. Patient states this is been ongoing since Friday but has been progressively worsening. Patient states up until now pain is intolerable and he has been able to work. 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 He states the lateral aspect of his left foot has become increasingly more red and swollen. He denies any fever or chills. No numbness or weakness of his left foot. He denies any known injury, trauma, or falls. No open wounds. Patient is not diabetic. Patient does not have a history of gout, states his father did have this. He has been eating red meat recently. No intervention tried prior to arrival.  Past Medical History  Diagnosis Date  . Back pain   . Thyroid disease   . Hypertension   . Anxiety    Past Surgical History  Procedure Laterality Date  . Back surgery    . Appendectomy    . Pleural scarification     No family history on file. Social History  Substance Use Topics  . Smoking status: Current Every Day Smoker  . Smokeless tobacco: None  . Alcohol Use: Yes     Comment: very rare    Review of Systems  Musculoskeletal: Positive for arthralgias.  All other systems reviewed and are negative.     Allergies  Review of patient's allergies indicates no known allergies.  Home Medications   Prior to Admission medications   Medication Sig Start Date End Date  Taking? Authorizing Provider  HYDROcodone-acetaminophen (NORCO/VICODIN) 5-325 MG per tablet Take 1-2 tablets by mouth every 6 hours as needed for pain. 07/19/13   Nicole Pisciotta, PA-C  lisinopril-hydrochlorothiazide (PRINZIDE,ZESTORETIC) 20-25 MG per tablet Take 0.5 tablets by mouth daily.    Historical Provider, MD   BP 125/85 mmHg  Pulse 83  Temp(Src) 97.7 F (36.5 C) (Oral)  Resp 16  SpO2 98%   Physical Exam  Constitutional: He is oriented to person, place, and time. He appears well-developed and well-nourished. No distress.  HENT:  Head: Normocephalic and atraumatic.  Mouth/Throat: Oropharynx is clear and moist.  Eyes: Conjunctivae and EOM are normal. Pupils are equal, round, and reactive to light.  Neck: Normal range of motion. Neck supple.  Cardiovascular: Normal rate, regular rhythm and normal heart sounds.   Pulmonary/Chest: Effort normal and breath sounds normal. No respiratory distress. He has no wheezes.  Musculoskeletal: Normal range of motion.  Left foot with erythema and swelling of lateral margins of left foot; slightly indurated and very tender to palpation; remainder of foot normal in appearance; DP pulse intact; moving all toes appropriately; normal sensation throughout foot; no open wounds or sores, no rashes  Neurological: He is alert and oriented to person, place, and time.  Skin: Skin is warm and dry. He is not diaphoretic.  Psychiatric: He has a normal mood and affect.  Nursing note and vitals reviewed.   ED Course  Procedures (including  critical care time) Labs Review Labs Reviewed - No data to display  Imaging Review Dg Foot Complete Left  04/06/2015  CLINICAL DATA:  Right lateral foot pain and soft tissue swelling, redness since Saturday. No known injury. EXAM: LEFT FOOT - COMPLETE 3+ VIEW COMPARISON:  None. FINDINGS: There is no evidence of fracture or dislocation. There is no evidence of arthropathy or other focal bone abnormality. Soft tissues are  unremarkable. IMPRESSION: Negative. Electronically Signed   By: Rolm Baptise M.D.   On: 04/06/2015 08:58   I have personally reviewed and evaluated these images and lab results as part of my medical decision-making.   EKG Interpretation None      MDM   Final diagnoses:  Left foot pain   55 year old male here with left foot pain which is been ongoing since Friday. Patient is afebrile, nontoxic. No known injury or trauma. On exam his left lateral foot has erythema and swelling with slight induration. This area is very tender to palpation, remainder of foot is normal in appearance. Foot is warm, well-perfused with normal sensation. X-ray was obtained which is negative for acute findings. Patient does have family history of gout, however no personal history. This may be gout flare, however this is somewhat of an atypical location and I feel it would be wise to cover him with antibiotics in case there is an overlying cellulitis as well. Do not suspect septic joint at this time.  Will start on Vicodin, indomethacin, and Bactrim.  Discussed plan with patient, he/she acknowledged understanding and agreed with plan of care.  Return precautions given for new or worsening symptoms.  Larene Pickett, PA-C 04/06/15 1119  Lacretia Leigh, MD 04/07/15 1520

## 2018-06-08 ENCOUNTER — Emergency Department (HOSPITAL_COMMUNITY)
Admission: EM | Admit: 2018-06-08 | Discharge: 2018-06-08 | Disposition: A | Discharge status: Home / Self Care | Payer: Self-pay | Attending: Emergency Medicine | Admitting: Emergency Medicine

## 2018-06-08 ENCOUNTER — Other Ambulatory Visit: Payer: Self-pay

## 2018-06-08 DIAGNOSIS — F17200 Nicotine dependence, unspecified, uncomplicated: Secondary | ICD-10-CM | POA: Insufficient documentation

## 2018-06-08 DIAGNOSIS — Z79899 Other long term (current) drug therapy: Secondary | ICD-10-CM | POA: Insufficient documentation

## 2018-06-08 DIAGNOSIS — I10 Essential (primary) hypertension: Secondary | ICD-10-CM | POA: Insufficient documentation

## 2018-06-08 DIAGNOSIS — L03012 Cellulitis of left finger: Secondary | ICD-10-CM | POA: Insufficient documentation

## 2018-06-08 MED ORDER — SULFAMETHOXAZOLE-TRIMETHOPRIM 800-160 MG PO TABS: 1.0000 | ORAL_TABLET | Freq: Two times a day (BID) | ORAL | 0 refills | Status: AC

## 2018-06-08 MED ORDER — LIDOCAINE HCL (PF) 1 % IJ SOLN
5.0000 mL | Freq: Once | INTRAMUSCULAR | Status: AC
Start: 2018-06-08 — End: 2018-06-08
  Administered 2018-06-08: 5 mL
  Filled 2018-06-08: qty 30

## 2018-06-08 NOTE — ED Notes (Signed)
Bed: WTR5 Expected date:  Expected time:  Means of arrival:  Comments: 

## 2018-06-08 NOTE — ED Provider Notes (Signed)
[place] DEPT Provider Note   CSN: [id number] Arrival date & time: 06/08/18  8315    History   Chief Complaint Chief Complaint  Patient presents with  . Hand Pain    Paronychia    HPI [name] is a 58 y.o. male.     58 year old male presents with complaint of left third fingernail infection.  Symptom started 5 days ago, getting progressively worse.  Denies injury to the finger.  No other complaints or concerns.     Past Medical History:  Diagnosis Date  . Anxiety   . Back pain   . Hypertension   . Thyroid disease     There are no active problems to display for this patient.   Past Surgical History:  Procedure Laterality Date  . APPENDECTOMY    . BACK SURGERY    . PLEURAL SCARIFICATION          Home Medications    Prior to Admission medications   Medication Sig Start Date End Date Taking? Authorizing Provider  HYDROcodone-acetaminophen (NORCO/VICODIN) 5-325 MG per tablet Take 1-2 tablets by mouth every 6 hours as needed for pain. 07/19/13   Pisciotta, Elmyra Ricks, PA-C  indomethacin (INDOCIN) 25 MG capsule Take 1 capsule (25 mg total) by mouth 3 (three) times daily as needed. 04/06/15   Larene Pickett, PA-C  lisinopril-hydrochlorothiazide (PRINZIDE,ZESTORETIC) 20-25 MG per tablet Take 0.5 tablets by mouth daily.    [provider]  oxyCODONE-acetaminophen (PERCOCET/ROXICET) 5-325 MG tablet Take 1 tablet by mouth every 4 (four) hours as needed. 04/06/15   Larene Pickett, PA-C  sulfamethoxazole-trimethoprim (BACTRIM DS) 800-160 MG tablet Take 1 tablet by mouth 2 (two) times daily for 7 days. 06/08/18 06/15/18  Tacy Learn, PA-C    Family History No family history on file.  Social History Social History   Tobacco Use  . Smoking status: Current Every Day Smoker  Substance Use Topics  . Alcohol use: Yes    Comment: very rare  . Drug use: No     Allergies   Patient has no known allergies.   Review of  Systems Review of Systems  Constitutional: Negative for chills and fever.  Musculoskeletal: Positive for myalgias.  Skin: Positive for color change.  Allergic/Immunologic: Negative for immunocompromised state.  Neurological: Negative for weakness and numbness.  Hematological: Negative for adenopathy. Does not bruise/bleed easily.     Physical Exam Updated Vital Signs BP (!) 173/102 (BP Location: Right Arm)   Pulse 80   Temp 97.9 F (36.6 C) (Oral)   Resp 18   SpO2 100%   Physical Exam Vitals signs and nursing note reviewed.  Constitutional:      General: He is not in acute distress.    Appearance: He is well-developed. He is not diaphoretic.  HENT:     Head: Normocephalic and atraumatic.  Cardiovascular:     Pulses: Normal pulses.  Pulmonary:     Effort: Pulmonary effort is normal.  Musculoskeletal: Normal range of motion.        General: Swelling and tenderness present. No signs of injury.       Hands:  Skin:    General: Skin is warm and dry.     Findings: Erythema present.  Neurological:     Mental Status: He is alert and oriented to person, place, and time.  Psychiatric:        Behavior: Behavior normal.      ED Treatments / Results  Labs (all  labs ordered are listed, but only abnormal results are displayed) Labs Reviewed - No data to display  EKG None  Radiology No results found.  Procedures .Marland KitchenIncision and Drainage Date/Time: 06/08/2018 10:39 AM Performed by: Tacy Learn, PA-C Authorized by: Tacy Learn, PA-C   Consent:    Consent obtained:  Verbal   Consent given by:  Patient   Risks discussed:  Bleeding, incomplete drainage, pain and damage to other organs   Alternatives discussed:  No treatment Universal protocol:    Procedure explained and questions answered to patient or proxy's satisfaction: yes     Relevant documents present and verified: yes     Test results available and properly labeled: yes     Imaging studies available:  yes     Required blood products, implants, devices, and special equipment available: yes     Site/side marked: yes     Immediately prior to procedure a time out was called: yes     Patient identity confirmed:  Verbally with patient Location:    Type:  Abscess (Paronychia)   Location:  Upper extremity   Upper extremity location:  Finger   Finger location:  L long finger Pre-procedure details:    Skin preparation:  Betadine Anesthesia (see MAR for exact dosages):    Anesthesia method:  Nerve block   Block location:  3rd MCP   Block needle gauge:  25 G   Block anesthetic:  Lidocaine 1% w/o epi   Block technique:  Digital block   Block injection procedure:  Anatomic landmarks palpated, introduced needle, negative aspiration for blood, anatomic landmarks identified and incremental injection   Block outcome:  Anesthesia achieved Procedure type:    Complexity:  Complex Procedure details:    Incision types:  Single straight   Incision depth:  Subcutaneous   Scalpel blade:  11   Wound management:  Irrigated with saline and extensive cleaning   Drainage:  Purulent   Drainage amount:  Moderate   Wound treatment:  Wound left open   Packing materials:  None Post-procedure details:    Patient tolerance of procedure:  Tolerated well, no immediate complications   (including critical care time)  Medications Ordered in ED Medications  lidocaine (PF) (XYLOCAINE) 1 % injection 5 mL (has no administration in time range)     Initial Impression / Assessment and Plan / ED Course  I have reviewed the triage vital signs and the nursing notes.  Pertinent labs & imaging results that were available during my care of the patient were reviewed by me and considered in my medical decision making (see chart for details).  Clinical Course as of Jun 07 1040  Sat Apr 25, 582  1465 58 year old male with paronychia of the left third finger.  Anesthetized with digital block and drained successfully.   Patient will be treated with Bactrim, sent to his pharmacy.  Also advised to do range of motion exercises while soaking in warm Epson salt water 3 times daily.  Patient should wound recheck in 2 days with PCP, return to ER for any new or worsening symptoms.   [LM]    Clinical Course User Index [LM] Tacy Learn, PA-C      Final Clinical Impressions(s) / ED Diagnoses   Final diagnoses:  Paronychia of finger of left hand    ED Discharge Orders         Ordered    sulfamethoxazole-trimethoprim (BACTRIM DS) 800-160 MG tablet  2 times daily  06/08/18 Rentz, Laura A, PA-C 06/08/18 1042    Jola Schmidt, MD 06/08/18 1306

## 2018-06-08 NOTE — ED Triage Notes (Signed)
Per Pt: Pt reports he had an ingrown nail Monday that has gotten worse. Obvious swelling and redness with purulent filling to the left middle finger.

## 2018-06-08 NOTE — Discharge Instructions (Addendum)
Soak finger in warm Epsom salt water, move finger through range of motion while soaking. Do this for 20 minutes 3 times daily. Motrin/Tylenol for pain as needed. Recheck with your doctor in 2 days, return to ER for any worsening or concerning symptoms.

## 2019-07-15 DIAGNOSIS — K4090 Unilateral inguinal hernia, without obstruction or gangrene, not specified as recurrent: Secondary | ICD-10-CM

## 2019-07-15 HISTORY — DX: Unilateral inguinal hernia, without obstruction or gangrene, not specified as recurrent: K40.90

## 2019-11-21 ENCOUNTER — Encounter: Payer: Self-pay | Admitting: Internal Medicine

## 2019-11-21 ENCOUNTER — Ambulatory Visit: Payer: Self-pay | Admitting: Internal Medicine

## 2019-11-21 VITALS — BP 152/84 | HR 64 | Ht 77.5 in | Wt 263.8 lb

## 2019-11-21 DIAGNOSIS — Z5989 Other problems related to housing and economic circumstances: Secondary | ICD-10-CM

## 2019-11-21 DIAGNOSIS — R195 Other fecal abnormalities: Secondary | ICD-10-CM

## 2019-11-21 NOTE — Progress Notes (Signed)
   [name] 59 y.o. 06/02/1960 [id number]  Assessment & Plan:   Encounter Diagnoses  Name Primary?  . Heme + stool Yes  . Uninsured     I explained that though the hemorrhoid may have been the cause of the heme positive stool we are not sure and a colonoscopy is in his best interest regarding his health.  He understands and agrees with this and is willing to proceed with a colonoscopy.    Plan was to schedule with Dr. Bryan Lemma while I am out about procedures due to an injury.  The patient will call back when he is able to determine the date he is able to get off work and have a ride.  Would use MiraLAX prep or samples to mitigate cost to the patient if at all possible.  He understands the rationale for colonoscopy is to look for colorectal neoplasia including cancer.  CC: Leonard Downing, MD     Subjective:   Chief Complaint: Heme positive stool  HPI 59 year old white man was found to have a Hemosure positive stool recently at Dr. Arelia Sneddon office.  He said while before that he had a bulging hemorrhoid that bled some he used over-the-counter hemorrhoid cream and those symptoms resolved.  Subsequent to that he was tested and was Hemosure positive.  Denies further bleeding at this time.  He does have some groin or abdominal discomfort on the left side related to a previously diagnosed left inguinal hernia that is being monitored.  Patient has some concerns about the ability to pay for his colonoscopy he needs a payment plan as he is uninsured.  No Known Allergies No outpatient medications have been marked as taking for the 11/21/19 encounter (Office Visit) with Gatha Mayer, MD.   Past Medical History:  Diagnosis Date  . Anxiety   . Back pain   . Hypertension   . Thyroid disease    Past Surgical History:  Procedure Laterality Date  . APPENDECTOMY    . BACK SURGERY    . PLEURAL SCARIFICATION     Social History   Social History Narrative   He is married    Employed as a Advice worker   Current smoker no alcohol or drug use   family history includes Diabetes in his mother.   Review of Systems As per HPI Objective:   Physical Exam BP (!) 152/84   Pulse 64   Ht 6' 5.5" (1.969 m)   Wt 263 lb 12.8 oz (119.7 kg)   SpO2 98%   BMI 30.88 kg/m  Tall white man no acute distress Lungs are clear Normal heart sounds Abdomen is soft and nontender without organomegaly or mass, there is a small to medium inguinal hernia in the groin area medial aspect above the scrotal area soft easily reducible nontender

## 2019-11-21 NOTE — Patient Instructions (Signed)
It has been recommended to you by your physician that you have a(n) Colonoscopy completed. Per your request, we did not schedule the procedure(s) today. Please contact our office at [phone] should you decide to have the procedure completed. You will be scheduled for a pre-visit and procedure at that time. We will put you on Dr Luanna Salk Cirigliano's schedule. Per Dr Carlean Purl use Miralax prep.   I appreciate the opportunity to care for you. Silvano Rusk, MD, [facility]

## 2019-11-24 ENCOUNTER — Encounter: Payer: Self-pay | Admitting: Gastroenterology

## 2020-01-16 ENCOUNTER — Ambulatory Visit (AMBULATORY_SURGERY_CENTER): Payer: Self-pay | Admitting: *Deleted

## 2020-01-16 ENCOUNTER — Other Ambulatory Visit: Payer: Self-pay

## 2020-01-16 VITALS — Ht 77.5 in | Wt 262.0 lb

## 2020-01-16 DIAGNOSIS — Z1211 Encounter for screening for malignant neoplasm of colon: Secondary | ICD-10-CM

## 2020-01-16 NOTE — Progress Notes (Signed)
Fully vax'd ° °No egg or soy allergy known to patient  °No issues with past sedation with any surgeries or procedures °no intubation problems in the past  °No FH of Malignant Hyperthermia °No diet pills per patient °No home 02 use per patient  °No blood thinners per patient  °Pt denies issues with constipation  °No A fib or A flutter  °EMMI video to pt or via MyChart  °COVID 19 guidelines implemented in PV today with Pt and RN  ° ° °Due to the COVID-19 pandemic we are asking patients to follow these guidelines. Please only bring one care partner. Please be aware that your care partner may wait in the car in the parking lot or if they feel like they will be too hot to wait in the car, they may wait in the lobby on the 4th floor. All care partners are required to wear a mask the entire time (we do not have any that we can provide them), they need to practice social distancing, and we will do a Covid check for all patient's and care partners when you arrive. Also we will check their temperature and your temperature. If the care partner waits in their car they need to stay in the parking lot the entire time and we will call them on their cell phone when the patient is ready for discharge so they can bring the car to the front of the building. Also all patient's will need to wear a mask into building. ° °

## 2020-01-30 ENCOUNTER — Other Ambulatory Visit: Payer: Self-pay

## 2020-01-30 ENCOUNTER — Ambulatory Visit (AMBULATORY_SURGERY_CENTER): Payer: Self-pay | Admitting: Gastroenterology

## 2020-01-30 ENCOUNTER — Encounter: Payer: Self-pay | Admitting: Gastroenterology

## 2020-01-30 VITALS — BP 114/88 | HR 53 | Temp 96.8°F | Resp 14 | Ht 77.5 in | Wt 262.0 lb

## 2020-01-30 DIAGNOSIS — D125 Benign neoplasm of sigmoid colon: Secondary | ICD-10-CM

## 2020-01-30 DIAGNOSIS — K6289 Other specified diseases of anus and rectum: Secondary | ICD-10-CM

## 2020-01-30 DIAGNOSIS — K640 First degree hemorrhoids: Secondary | ICD-10-CM

## 2020-01-30 DIAGNOSIS — R195 Other fecal abnormalities: Secondary | ICD-10-CM

## 2020-01-30 DIAGNOSIS — D124 Benign neoplasm of descending colon: Secondary | ICD-10-CM

## 2020-01-30 DIAGNOSIS — K648 Other hemorrhoids: Secondary | ICD-10-CM

## 2020-01-30 MED ORDER — SODIUM CHLORIDE 0.9 % IV SOLN: 500.0000 mL | Freq: Once | INTRAVENOUS | Status: DC

## 2020-01-30 NOTE — Progress Notes (Signed)
A and O x3. Report to RN. Tolerated MAC anesthesia well.

## 2020-01-30 NOTE — Op Note (Signed)
[name]: [name] Procedure Date: 01/30/2020 2:52 PM MRN: [id number] Endoscopist: Gerrit Heck , MD Age: 59 Referring MD:  Date of Birth: 1960/05/25 Gender: Male Account #: [id number] Procedure:                Colonoscopy Indications:              Heme positive stool Medicines:                Monitored Anesthesia Care Procedure:                Pre-Anesthesia Assessment:                           - Prior to the procedure, a History and Physical                            was performed, and patient medications and                            allergies were reviewed. The patient's tolerance of                            previous anesthesia was also reviewed. The risks                            and benefits of the procedure and the sedation                            options and risks were discussed with the patient.                            All questions were answered, and informed consent                            was obtained. Prior Anticoagulants: The patient has                            taken no previous anticoagulant or antiplatelet                            agents. ASA Grade Assessment: II - A patient with                            mild systemic disease. After reviewing the risks                            and benefits, the patient was deemed in                            satisfactory condition to undergo the procedure.                           After obtaining informed consent, the colonoscope  was passed under direct vision. Throughout the                            procedure, the patient's blood pressure, pulse, and                            oxygen saturations were monitored continuously. The                            Olympus CF-HQ190 [id number]) Colonoscope was                            introduced through the anus and advanced to the the                            terminal ileum. The colonoscopy was performed                             without difficulty. The patient tolerated the                            procedure well. The quality of the bowel                            preparation was good. The terminal ileum, ileocecal                            valve, appendiceal orifice, and rectum were                            photographed. Scope In: 2:58:50 PM Scope Out: 3:21:04 PM Scope Withdrawal Time: 0 hours 17 minutes 23 seconds  Total Procedure Duration: 0 hours 22 minutes 14 seconds  Findings:                 The perianal and digital rectal examinations were                            normal.                           Five sessile polyps were found in the sigmoid colon                            and descending colon. The polyps were 3 to 5 mm in                            size. These polyps were removed with a cold snare.                            Resection and retrieval were complete. Estimated                            blood loss was minimal.  Non-bleeding internal hemorrhoids were found during                            retroflexion. The hemorrhoids were small.                           Anal papilla(e) were hypertrophied.                           The terminal ileum appeared normal. Complications:            No immediate complications. Estimated Blood Loss:     Estimated blood loss was minimal. Impression:               - Five 3 to 5 mm polyps in the sigmoid colon and in                            the descending colon, removed with a cold snare.                            Resected and retrieved.                           - Non-bleeding internal hemorrhoids.                           - Anal papilla(e) were hypertrophied.                           - The examined portion of the ileum was normal. Recommendation:           - Patient has a contact number available for                            emergencies. The signs and symptoms of potential                             delayed complications were discussed with the                            patient. Return to normal activities tomorrow.                            Written discharge instructions were provided to the                            patient.                           - Resume previous diet.                           - Continue present medications.                           - Await pathology results.                           -  Repeat colonoscopy for surveillance based on                            pathology results.                           - Return to GI office PRN.                           - Use fiber, for example Citrucel, Fibercon, Konsyl                            or Metamucil.                           - Internal hemorrhoids were noted on this study and                            may be amenable to hemorrhoid band ligation. If you                            are interested in further treatment of these                            hemorrhoids with band ligation, please contact the                            GI clinic to set up an appointment for evaluation                            and treatment. Gerrit Heck, MD 01/30/2020 3:28:44 PM

## 2020-01-30 NOTE — Progress Notes (Signed)
Called to room to assist during endoscopic procedure.  Patient ID and intended procedure confirmed with present staff. Received instructions for my participation in the procedure from the performing physician.  

## 2020-01-30 NOTE — Patient Instructions (Signed)
Discharge instructions given. Handouts on polyps and hemorrhoids. Resume previous medications. YOU HAD AN ENDOSCOPIC PROCEDURE TODAY AT THE [facility]:   Refer to the procedure report that was given to you for any specific questions about what was found during the examination.  If the procedure report does not answer your questions, please call your gastroenterologist to clarify.  If you requested that your care partner not be given the details of your procedure findings, then the procedure report has been included in a sealed envelope for you to review at your convenience later.  YOU SHOULD EXPECT: Some feelings of bloating in the abdomen. Passage of more gas than usual.  Walking can help get rid of the air that was put into your GI tract during the procedure and reduce the bloating. If you had a lower endoscopy (such as a colonoscopy or flexible sigmoidoscopy) you may notice spotting of blood in your stool or on the toilet paper. If you underwent a bowel prep for your procedure, you may not have a normal bowel movement for a few days.  Please Note:  You might notice some irritation and congestion in your nose or some drainage.  This is from the oxygen used during your procedure.  There is no need for concern and it should clear up in a day or so.  SYMPTOMS TO REPORT IMMEDIATELY:  Following lower endoscopy (colonoscopy or flexible sigmoidoscopy):  Excessive amounts of blood in the stool  Significant tenderness or worsening of abdominal pains  Swelling of the abdomen that is new, acute  Fever of 100F or higher   For urgent or emergent issues, a gastroenterologist can be reached at any hour by calling [phone]. Do not use MyChart messaging for urgent concerns.    DIET:  We do recommend a small meal at first, but then you may proceed to your regular diet.  Drink plenty of fluids but you should avoid alcoholic beverages for 24 hours.  ACTIVITY:  You should plan to take it  easy for the rest of today and you should NOT DRIVE or use heavy machinery until tomorrow (because of the sedation medicines used during the test).    FOLLOW UP: Our staff will call the number listed on your records 48-72 hours following your procedure to check on you and address any questions or concerns that you may have regarding the information given to you following your procedure. If we do not reach you, we will leave a message.  We will attempt to reach you two times.  During this call, we will ask if you have developed any symptoms of COVID 19. If you develop any symptoms (ie: fever, flu-like symptoms, shortness of breath, cough etc.) before then, please call [phone].  If you test positive for Covid 19 in the 2 weeks post procedure, please call and report this information to us.    If any biopsies were taken you will be contacted by phone or by letter within the next 1-3 weeks.  Please call us at [phone] if you have not heard about the biopsies in 3 weeks.    SIGNATURES/CONFIDENTIALITY: You and/or your care partner have signed paperwork which will be entered into your electronic medical record.  These signatures attest to the fact that that the information above on your After Visit Summary has been reviewed and is understood.  Full responsibility of the confidentiality of this discharge information lies with you and/or your care-partner.  

## 2020-01-30 NOTE — Progress Notes (Signed)
Medical history reviewed with no changes noted. VS assessed by S.P 

## 2020-02-03 ENCOUNTER — Telehealth: Payer: Self-pay | Admitting: *Deleted

## 2020-02-03 NOTE — Telephone Encounter (Signed)
  Follow up Call-  Call back number 01/30/2020  Post procedure Call Back phone  # [phone]  Permission to leave phone message Yes  Some recent data might be hidden     Patient questions:  Do you have a fever, pain , or abdominal swelling? No. Pain Score  0 *  Have you tolerated food without any problems? Yes.    Have you been able to return to your normal activities? Yes.    Do you have any questions about your discharge instructions: Diet   No. Medications  No. Follow up visit  No.  Do you have questions or concerns about your Care? No.  Actions: * If pain score is 4 or above: No action needed, pain <4.  1. Have you developed a fever since your procedure? no  2.   Have you had an respiratory symptoms (SOB or cough) since your procedure? no  3.   Have you tested positive for COVID 19 since your procedure no  4.   Have you had any family members/close contacts diagnosed with the COVID 19 since your procedure? no   If yes to any of these questions please route to Joylene John, RN and Joella Prince, RN

## 2020-02-18 ENCOUNTER — Encounter: Payer: Self-pay | Admitting: Gastroenterology

## 2020-12-01 ENCOUNTER — Emergency Department (HOSPITAL_COMMUNITY): Payer: Self-pay

## 2020-12-01 ENCOUNTER — Encounter (HOSPITAL_COMMUNITY): Payer: Self-pay

## 2020-12-01 ENCOUNTER — Other Ambulatory Visit: Payer: Self-pay

## 2020-12-01 ENCOUNTER — Emergency Department (HOSPITAL_COMMUNITY)
Admission: EM | Admit: 2020-12-01 | Discharge: 2020-12-01 | Disposition: A | Discharge status: Home / Self Care | Payer: Self-pay | Attending: Emergency Medicine | Admitting: Emergency Medicine

## 2020-12-01 DIAGNOSIS — F17210 Nicotine dependence, cigarettes, uncomplicated: Secondary | ICD-10-CM | POA: Insufficient documentation

## 2020-12-01 DIAGNOSIS — M109 Gout, unspecified: Secondary | ICD-10-CM | POA: Insufficient documentation

## 2020-12-01 DIAGNOSIS — M25562 Pain in left knee: Secondary | ICD-10-CM | POA: Insufficient documentation

## 2020-12-01 DIAGNOSIS — I10 Essential (primary) hypertension: Secondary | ICD-10-CM | POA: Insufficient documentation

## 2020-12-01 MED ORDER — NAPROXEN 500 MG PO TABS
500.0000 mg | ORAL_TABLET | Freq: Once | ORAL | Status: AC
  Administered 2020-12-01: 500 mg via ORAL
  Filled 2020-12-01: qty 1

## 2020-12-01 MED ORDER — INDOMETHACIN 50 MG PO CAPS: 50.0000 mg | ORAL_CAPSULE | Freq: Three times a day (TID) | ORAL | 0 refills | Status: AC

## 2020-12-01 NOTE — ED Notes (Signed)
An After Visit Summary was printed and given to the patient. Discharge instructions given and no further questions at this time.  

## 2020-12-01 NOTE — ED Triage Notes (Signed)
Pt c/o left knee pain starting 1 week ago. Pt spoke to his PCP who put him on a steroid pack that he started 5 days ago with no relief. Pt states he has gout but has never had it in his knees before.

## 2020-12-01 NOTE — Discharge Instructions (Addendum)
You were seen in the emergency department today for left knee pain.  I think your symptoms are most likely related to an acute gout flare.  The x-ray performed today showed no acute fractures or dislocations, but commented on some mild arthritis.   Since the steroids were not helpful we will treat with a medicine called indomethacin, this is a strong NSAID.  I would like you to take this 3 times daily for the next 5 days.  If your symptoms persist past that I like you to follow-up with the orthopedic doctor, and I have attached their contact information for you to make an appointment.  Continue to monitor how you're doing and return to the ER for new or worsening symptoms such as numbness, tingling, weakness of your knee.   It has been a pleasure seeing and caring for you today and I hope you start feeling better soon!

## 2020-12-01 NOTE — ED Provider Notes (Signed)
[facility] DEPT Provider Note   CSN: [id number] Arrival date & time: 12/01/20  1912     History Chief Complaint  Patient presents with   Left Knee Pain    [name] is a 60 y.o. male with history of gout who presents with left knee pain x1 week.   He states that his pain is made worse with bending of his left knee, no change with weightbearing.  Describes his pain as sharp and severe. No numbness, tingling, or weakness. He states that he has a history of gout but never had it in his knees before. Patient had discussed with his primary care who put him on a steroid pack that he started 5 days ago, and he states he had no relief from this.  He is also been trying ibuprofen and Tylenol without relief. There was no inciting injury or recent illness.  No history of blood clots.  HPI     Past Medical History:  Diagnosis Date   Anxiety    Arthritis    in back    Astigmatism    Back pain    Hyperlipidemia    past hx    Hypertension    Inguinal hernia of left side without obstruction or gangrene 07/15/2019   Thyroid disease     There are no problems to display for this patient.   Past Surgical History:  Procedure Laterality Date   APPENDECTOMY     BACK SURGERY     PLEURAL SCARIFICATION         Family History  Problem Relation Age of Onset   Diabetes Mother    Colon cancer Neg Hx    Pancreatic cancer Neg Hx    Esophageal cancer Neg Hx    Stomach cancer Neg Hx    Liver disease Neg Hx    Colon polyps Neg Hx    Rectal cancer Neg Hx     Social History   Tobacco Use   Smoking status: Every Day    Packs/day: 1.50    Years: 20.00    Pack years: 30.00    Types: Cigarettes   Smokeless tobacco: Never  Vaping Use   Vaping Use: Never used  Substance Use Topics   Alcohol use: Not Currently   Drug use: No    Home Medications Prior to Admission medications   Medication Sig Start Date End Date Taking? Authorizing Provider   indomethacin (INDOCIN) 50 MG capsule Take 1 capsule (50 mg total) by mouth 3 (three) times daily with meals for 5 days. 12/01/20 12/06/20 Yes Naseer Hearn T, PA-C  ibuprofen (ADVIL) 200 MG tablet Take 800 mg by mouth every 6 (six) hours as needed.    [provider]    Allergies    Patient has no known allergies.  Review of Systems   Review of Systems  Constitutional:  Negative for chills and fever.  Musculoskeletal:  Positive for joint swelling.       Left knee pain  Skin:  Negative for color change, rash and wound.  Neurological:  Negative for weakness and numbness.  All other systems reviewed and are negative.  Physical Exam Updated Vital Signs BP (!) 178/78 (BP Location: Left Arm)   Pulse 70   Temp 98 F (36.7 C) (Oral)   Resp 18   Ht 6\' 6"  (1.981 m)   Wt 119.3 kg   SpO2 97%   BMI 30.39 kg/m   Physical Exam Vitals and nursing note reviewed.  Constitutional:      Appearance: Normal appearance.  HENT:     Head: Normocephalic and atraumatic.  Eyes:     Conjunctiva/sclera: Conjunctivae normal.  Pulmonary:     Effort: Pulmonary effort is normal. No respiratory distress.  Musculoskeletal:     Comments: Left knee held in full extension in position of comfort.  Full passive ROM of left knee, but with significant pain.  Diffuse swelling of the left knee with point tenderness just proximal to the patella.  No wounds, erythema, or increased warmth.  Pulses equal and normal in BLE.  Sensation intact bilaterally.  Skin:    General: Skin is warm and dry.  Neurological:     Mental Status: He is alert.  Psychiatric:        Mood and Affect: Mood normal.        Behavior: Behavior normal.    ED Results / Procedures / Treatments   Labs (all labs ordered are listed, but only abnormal results are displayed) Labs Reviewed - No data to display  EKG None  Radiology DG Knee 2 Views Left  Result Date: 12/01/2020 CLINICAL DATA:  Knee pain and swelling for 1 week,  no known injury, initial encounter EXAM: LEFT KNEE - 1-2 VIEW COMPARISON:  None. FINDINGS: No acute fracture or dislocation is noted. Mild medial joint space and patellofemoral degenerative changes are noted. No soft tissue abnormality is seen. IMPRESSION: Mild degenerative change without acute abnormality Electronically Signed   By: Inez Catalina M.D.   On: 12/01/2020 20:32    Procedures Procedures   Medications Ordered in ED Medications  naproxen (NAPROSYN) tablet 500 mg (500 mg Oral Given 12/01/20 2020)    ED Course  I have reviewed the triage vital signs and the nursing notes.  Pertinent labs & imaging results that were available during my care of the patient were reviewed by me and considered in my medical decision making (see chart for details).    MDM Rules/Calculators/A&P                           Patient is 60 year old male with history of gout who presents with left knee pain x1 week.  Patient was put on steroid pack by his PCP, with no relief.  He states that his pain is made worse with bending of his left knee, no change with weightbearing.  There is no inciting injury.  No recent illness.  On exam there is generalized swelling of the left knee, with point tenderness just proximal to the patella.  There is no wounds, or overlying skin changes such as increased redness or warmth.  I have low suspicion for infection or septic joint. Wells criteria negative for DVT. XR negative for acute bony abnormalities.   Symptoms most likely related to acute gout flare.  Plan to treat with indomethacin and immobilization.  As symptoms have been going on for a week, I do not think that colchicine would be as effective today. Discussed follow-up with primary care and orthopedic provider symptoms persist.  Discussed reasons to return to the emergency department.  Patient agreeable to plan.  Final Clinical Impression(s) / ED Diagnoses Final diagnoses:  Acute pain of left knee  Acute gout of left  knee, unspecified cause    Rx / DC Orders ED Discharge Orders          Ordered    indomethacin (INDOCIN) 50 MG capsule  3 times daily with meals  12/01/20 2052             Ketsia Linebaugh T, PA-C 12/01/20 2056    Isla Pence, MD 12/01/20 2314

## 2023-02-10 ENCOUNTER — Emergency Department (HOSPITAL_COMMUNITY): Payer: Self-pay

## 2023-02-10 ENCOUNTER — Observation Stay (HOSPITAL_COMMUNITY): Payer: Self-pay

## 2023-02-10 ENCOUNTER — Other Ambulatory Visit: Payer: Self-pay

## 2023-02-10 ENCOUNTER — Observation Stay (HOSPITAL_COMMUNITY)
Admission: EM | Admit: 2023-02-10 | Discharge: 2023-02-11 | Disposition: A | Discharge status: Home / Self Care | Payer: BLUE CROSS/BLUE SHIELD | Attending: Internal Medicine | Admitting: Internal Medicine

## 2023-02-10 ENCOUNTER — Encounter (HOSPITAL_COMMUNITY): Payer: Self-pay

## 2023-02-10 DIAGNOSIS — R002 Palpitations: Secondary | ICD-10-CM | POA: Insufficient documentation

## 2023-02-10 DIAGNOSIS — I6522 Occlusion and stenosis of left carotid artery: Secondary | ICD-10-CM | POA: Insufficient documentation

## 2023-02-10 DIAGNOSIS — H53132 Sudden visual loss, left eye: Secondary | ICD-10-CM | POA: Diagnosis present

## 2023-02-10 DIAGNOSIS — E039 Hypothyroidism, unspecified: Secondary | ICD-10-CM | POA: Insufficient documentation

## 2023-02-10 DIAGNOSIS — H3412 Central retinal artery occlusion, left eye: Principal | ICD-10-CM | POA: Insufficient documentation

## 2023-02-10 DIAGNOSIS — Z7982 Long term (current) use of aspirin: Secondary | ICD-10-CM | POA: Insufficient documentation

## 2023-02-10 DIAGNOSIS — F17210 Nicotine dependence, cigarettes, uncomplicated: Secondary | ICD-10-CM | POA: Diagnosis not present

## 2023-02-10 DIAGNOSIS — E785 Hyperlipidemia, unspecified: Secondary | ICD-10-CM | POA: Insufficient documentation

## 2023-02-10 DIAGNOSIS — Z79899 Other long term (current) drug therapy: Secondary | ICD-10-CM | POA: Diagnosis not present

## 2023-02-10 DIAGNOSIS — I10 Essential (primary) hypertension: Secondary | ICD-10-CM | POA: Diagnosis not present

## 2023-02-10 DIAGNOSIS — H3410 Central retinal artery occlusion, unspecified eye: Secondary | ICD-10-CM | POA: Diagnosis present

## 2023-02-10 LAB — CBC
HCT: 42.4 % (ref 39.0–52.0)
Hemoglobin: 14.3 g/dL (ref 13.0–17.0)
MCH: 30.2 pg (ref 26.0–34.0)
MCHC: 33.7 g/dL (ref 30.0–36.0)
MCV: 89.5 fL (ref 80.0–100.0)
Platelets: 207 10*3/uL (ref 150–400)
RBC: 4.74 MIL/uL (ref 4.22–5.81)
RDW: 13.9 % (ref 11.5–15.5)
WBC: 12.1 10*3/uL — ABNORMAL HIGH (ref 4.0–10.5)
nRBC: 0 % (ref 0.0–0.2)

## 2023-02-10 LAB — RAPID URINE DRUG SCREEN, HOSP PERFORMED
Amphetamines: NOT DETECTED
Barbiturates: NOT DETECTED
Benzodiazepines: NOT DETECTED
Cocaine: NOT DETECTED
Opiates: NOT DETECTED
Tetrahydrocannabinol: NOT DETECTED

## 2023-02-10 LAB — COMPREHENSIVE METABOLIC PANEL
ALT: 16 U/L (ref 0–44)
AST: 18 U/L (ref 15–41)
Albumin: 3.9 g/dL (ref 3.5–5.0)
Alkaline Phosphatase: 88 U/L (ref 38–126)
Anion gap: 8 (ref 5–15)
BUN: 15 mg/dL (ref 6–20)
CO2: 23 mmol/L (ref 22–32)
Calcium: 9 mg/dL (ref 8.9–10.3)
Chloride: 106 mmol/L (ref 98–111)
Creatinine, Ser: 1.07 mg/dL (ref 0.61–1.24)
GFR, Estimated: >60 mL/min (ref >60)
Glucose, Bld: 96 mg/dL (ref 70–99)
Potassium: 4 mmol/L (ref 3.5–5.1)
Sodium: 137 mmol/L (ref 135–145)
Total Bilirubin: 0.7 mg/dL (ref <1.2)
Total Protein: 7.1 g/dL (ref 6.5–8.1)

## 2023-02-10 LAB — URINALYSIS, ROUTINE W REFLEX MICROSCOPIC
Bilirubin Urine: NEGATIVE
Glucose, UA: NEGATIVE mg/dL
Hgb urine dipstick: NEGATIVE
Ketones, ur: NEGATIVE mg/dL
Leukocytes,Ua: NEGATIVE
Nitrite: NEGATIVE
Protein, ur: NEGATIVE mg/dL
Specific Gravity, Urine: 1.012 (ref 1.005–1.030)
pH: 6 (ref 5.0–8.0)

## 2023-02-10 LAB — APTT: aPTT: 31 s (ref 24–36)

## 2023-02-10 LAB — DIFFERENTIAL
Abs Immature Granulocytes: 0.09 10*3/uL — ABNORMAL HIGH (ref 0.00–0.07)
Basophils Absolute: 0.1 10*3/uL (ref 0.0–0.1)
Basophils Relative: 1 %
Eosinophils Absolute: 0.2 10*3/uL (ref 0.0–0.5)
Eosinophils Relative: 2 %
Immature Granulocytes: 1 %
Lymphocytes Relative: 16 %
Lymphs Abs: 1.9 10*3/uL (ref 0.7–4.0)
Monocytes Absolute: 0.7 10*3/uL (ref 0.1–1.0)
Monocytes Relative: 6 %
Neutro Abs: 9 10*3/uL — ABNORMAL HIGH (ref 1.7–7.7)
Neutrophils Relative %: 74 %

## 2023-02-10 LAB — ETHANOL: Alcohol, Ethyl (B): <10 mg/dL (ref <10)

## 2023-02-10 LAB — C-REACTIVE PROTEIN: CRP: 0.5 mg/dL (ref <1.0)

## 2023-02-10 LAB — PROTIME-INR
INR: 1 (ref 0.8–1.2)
Prothrombin Time: 13.6 s (ref 11.4–15.2)

## 2023-02-10 LAB — SEDIMENTATION RATE: Sed Rate: 9 mm/h (ref 0–16)

## 2023-02-10 MED ORDER — ACETAMINOPHEN 325 MG PO TABS: 650.0000 mg | ORAL_TABLET | Freq: Four times a day (QID) | ORAL | Status: DC | PRN

## 2023-02-10 MED ORDER — STROKE: EARLY STAGES OF RECOVERY BOOK
Freq: Once | Status: AC
  Filled 2023-02-10: qty 1

## 2023-02-10 MED ORDER — LEVOTHYROXINE SODIUM 112 MCG PO TABS
112.0000 ug | ORAL_TABLET | Freq: Every day | ORAL | Status: DC
  Administered 2023-02-11: 112 ug via ORAL
  Filled 2023-02-10: qty 1

## 2023-02-10 MED ORDER — ALBUTEROL SULFATE (2.5 MG/3ML) 0.083% IN NEBU: 2.5000 mg | INHALATION_SOLUTION | Freq: Four times a day (QID) | RESPIRATORY_TRACT | Status: DC | PRN

## 2023-02-10 MED ORDER — ASPIRIN 81 MG PO CHEW
81.0000 mg | CHEWABLE_TABLET | Freq: Once | ORAL | Status: AC
  Administered 2023-02-10: 81 mg via ORAL
  Filled 2023-02-10: qty 1

## 2023-02-10 MED ORDER — CLOPIDOGREL BISULFATE 75 MG PO TABS
75.0000 mg | ORAL_TABLET | Freq: Every day | ORAL | Status: DC
  Administered 2023-02-10 – 2023-02-11 (×2): 75 mg via ORAL
  Filled 2023-02-10 (×2): qty 1

## 2023-02-10 MED ORDER — NICOTINE 21 MG/24HR TD PT24
21.0000 mg | MEDICATED_PATCH | Freq: Every day | TRANSDERMAL | Status: DC
  Administered 2023-02-10 – 2023-02-11 (×2): 21 mg via TRANSDERMAL
  Filled 2023-02-10 (×2): qty 1

## 2023-02-10 MED ORDER — ASPIRIN 81 MG PO TBEC
81.0000 mg | DELAYED_RELEASE_TABLET | Freq: Every day | ORAL | Status: DC
  Administered 2023-02-10 – 2023-02-11 (×2): 81 mg via ORAL
  Filled 2023-02-10 (×2): qty 1

## 2023-02-10 MED ORDER — HYDRALAZINE HCL 20 MG/ML IJ SOLN
10.0000 mg | Freq: Four times a day (QID) | INTRAMUSCULAR | Status: DC | PRN
  Administered 2023-02-10 – 2023-02-11 (×2): 10 mg via INTRAVENOUS
  Filled 2023-02-10 (×2): qty 1

## 2023-02-10 MED ORDER — IOHEXOL 350 MG/ML SOLN
75.0000 mL | Freq: Once | INTRAVENOUS | Status: AC | PRN
  Administered 2023-02-10: 75 mL via INTRAVENOUS

## 2023-02-10 MED ORDER — ENOXAPARIN SODIUM 40 MG/0.4ML IJ SOSY
40.0000 mg | PREFILLED_SYRINGE | INTRAMUSCULAR | Status: DC
  Administered 2023-02-10: 40 mg via SUBCUTANEOUS
  Filled 2023-02-10: qty 0.4

## 2023-02-10 MED ORDER — POLYETHYLENE GLYCOL 3350 17 G PO PACK: 17.0000 g | PACK | Freq: Every day | ORAL | Status: DC | PRN

## 2023-02-10 MED ORDER — ACETAMINOPHEN 650 MG RE SUPP: 650.0000 mg | Freq: Four times a day (QID) | RECTAL | Status: DC | PRN

## 2023-02-10 MED ORDER — BISACODYL 5 MG PO TBEC
5.0000 mg | DELAYED_RELEASE_TABLET | Freq: Every day | ORAL | Status: DC | PRN
Start: 2023-02-10 — End: 2023-02-11

## 2023-02-10 MED ORDER — ROSUVASTATIN CALCIUM 5 MG PO TABS
5.0000 mg | ORAL_TABLET | Freq: Every day | ORAL | Status: DC
  Administered 2023-02-11: 5 mg via ORAL
  Filled 2023-02-10: qty 1

## 2023-02-10 MED ORDER — CLOPIDOGREL BISULFATE 75 MG PO TABS
75.0000 mg | ORAL_TABLET | Freq: Once | ORAL | Status: AC
  Administered 2023-02-10: 75 mg via ORAL
  Filled 2023-02-10: qty 1

## 2023-02-10 NOTE — ED Triage Notes (Signed)
Reports went to bed last night around 830-9pm and woke up this morning with loss of vision in left eye.  Reports its like looking at a tv with static.  Denies headache weakness.

## 2023-02-10 NOTE — H&P (Signed)
[facility] History and Physical  [name] XBJ:[id number] DOB: 03/22/1960 DOA: 02/10/2023 PCP: Kaleen Mask, MD  Presented from: home Chief Complaint: left eye loss of vision  History of Present Illness: [name] is a 62 y.o. male with PMH significant for HTN, HLD arthritis, anxiety, hypothyroidism. Patient presented to the ED this morning with complaint of loss of vision in the left eye. Patient went to bed last night around 830 to 9 PM in usual state of his health and woke up this morning around 7 AM with some dark spot on the left side but no total visual field loss at the time.  He went back to sleep woke up an hour later with total resulted loss in the left. Of note, his history of migraine off and on but no headache today.  In the ED, afebrile, heart rate in 60s, blood pressure 189/85, breathing on room air Labs with WC count 12.1, otherwise unremarkable Blood alcohol level not elevated. CT angio of head and neck 1. Negative for large vessel occlusion or intracranial aneurysm. But positive for bulky left carotid  bifurcation plaque with high-grade proximal left ICA stenosis estimated at 80%, approaching a radiographic-string-sign. 2. Moderate stenosis of the left vertebral artery origin related to soft plaque. Mild distal left vertebral artery stenosis. 3. No definite acute intracranial abnormality. By head CT, suspect artifactual hypodensity at the right occipital pole.  Patient was seen by ophthalmologist Dr. Sherryll Burger..  He diagnosed CRAO. Recommended inpatient management for stroke workup. Neurology and vascular surgery were consulted as well.  At the time of my evaluation, patient was lying on bed.  He was just back from MRI.  He has not had any improvement in left eye vision. Wife at bedside. Patient smokes more than 1 pack/day.  He regrets not being able to quit. Also says 'I don't eat healthy.'  Review of Systems:  All systems were reviewed and  were negative unless otherwise mentioned in the HPI   Past medical history: Past Medical History:  Diagnosis Date   Anxiety    Arthritis    in back    Astigmatism    Back pain    Hyperlipidemia    past hx    Hypertension    Inguinal hernia of left side without obstruction or gangrene 07/15/2019   Thyroid disease     Past surgical history: Past Surgical History:  Procedure Laterality Date   APPENDECTOMY     BACK SURGERY     PLEURAL SCARIFICATION      Social History:  reports that he has been smoking cigarettes. He has a 30 pack-year smoking history. He has never used smokeless tobacco. He reports that he does not currently use alcohol. He reports that he does not use drugs.  Allergies:  No Known Allergies Patient has no known allergies.   Family history:  Family History  Problem Relation Age of Onset   Diabetes Mother    Colon cancer Neg Hx    Pancreatic cancer Neg Hx    Esophageal cancer Neg Hx    Stomach cancer Neg Hx    Liver disease Neg Hx    Colon polyps Neg Hx    Rectal cancer Neg Hx      Physical Exam: Vitals:   02/10/23 1115 02/10/23 1116 02/10/23 1230 02/10/23 1336  BP: (!) 203/96  (!) 180/92   Pulse: 91 86 67   Resp: 19 19 17    Temp:    98.1 F (36.7 C)  TempSrc:      SpO2: 100% 100% 98%   Weight:      Height:       Wt Readings from Last 3 Encounters:  02/10/23 119.3 kg  12/01/20 119.3 kg  01/30/20 118.8 kg   Body mass index is 30.39 kg/m.  General exam: Pleasant, middle-aged Caucasian male. Skin: No rashes, lesions or ulcers. HEENT: Atraumatic, normocephalic, no obvious bleeding Lungs: Clear to auscultation bilaterally,  CVS: S1, S2, no murmur,   GI/Abd: Soft, nontender, nondistended, bowel sound present,   CNS: Alert, awake, oriented x 3.  Left eye vision loss persists.  No other focal deficit Psychiatry: Mood appropriate,  Extremities: No pedal edema, no calf tenderness,     ----------------------------------------------------------------------------------------------------------------------------------------- ----------------------------------------------------------------------------------------------------------------------------------------- -----------------------------------------------------------------------------------------------------------------------------------------  Assessment/Plan: Principal Problem:   CRAO (central retinal artery occlusion)  Left CRAO Presented with left-sided vision loss Evaluated by ophthalmology in the ED. Recommended stroke workup CT angio without large vessel occlusion.  MRI brain pending Ordered for carotid duplex, TTE As recommended by ophthalmology, ordered for blood work to rule out GCA - ESR, CRP, PLT counts Proctology, patient w/ crowded optic nerve and at life long risk for NAION and given now functionally monocular would avoid any GLP medications such as Ozempic due to potentially increased risk for NAION Outpatient follow-up 2-4 weeks w/ Ophthalmology  Left ICA stenosis CT angio with left ICA stenosis of 80% Vascular surgery consulted.  Noted a plan of CAE this hospitalization.  Hypertension Blood pressure significantly elevated close to 200 Not on any antihypertensives at home. Permissive hypertension  HLD Crestor  Hypothyroidism Synthroid  Mobility: PT eval  Goals of care:   Code Status: Full Code    DVT prophylaxis:  enoxaparin (LOVENOX) injection 40 mg Start: 02/10/23 1400   Antimicrobials: None Fluid: None Consultants: Family, neurology, vascular surgery Family Communication: Wife at bedside  Dispo: The patient is from: Home              Anticipated d/c is to: Hopefully home in 2 to 3 days  Diet: Diet Order             Diet NPO time specified  Diet effective now                     ------------------------------------------------------------------------------------- Severity of Illness: The appropriate patient status for this patient is OBSERVATION. Observation status is judged to be reasonable and necessary in order to provide the required intensity of service to ensure the patient's safety. The patient's presenting symptoms, physical exam findings, and initial radiographic and laboratory data in the context of their medical condition is felt to place them at decreased risk for further clinical deterioration. Furthermore, it is anticipated that the patient will be medically stable for discharge from the hospital within 2 midnights of admission.  -------------------------------------------------------------------------------------   Home Meds: Prior to Admission medications   Medication Sig Start Date End Date Taking? Authorizing Provider  levothyroxine (SYNTHROID) 112 MCG tablet Take 112 mcg by mouth every morning. 12/22/22  Yes [provider]  rosuvastatin (CRESTOR) 5 MG tablet Take 5 mg by mouth daily. 12/22/22  Yes [provider]    Labs on Admission:   CBC: Recent Labs  Lab 02/10/23 1016  WBC 12.1*  NEUTROABS 9.0*  HGB 14.3  HCT 42.4  MCV 89.5  PLT 207    Basic Metabolic Panel: Recent Labs  Lab 02/10/23 1016  NA 137  K 4.0  CL 106  CO2 23  GLUCOSE 96  BUN  15  CREATININE 1.07  CALCIUM 9.0    Liver Function Tests: Recent Labs  Lab 02/10/23 1016  AST 18  ALT 16  ALKPHOS 88  BILITOT 0.7  PROT 7.1  ALBUMIN 3.9   No results for input(s): "LIPASE", "AMYLASE" in the last 168 hours. No results for input(s): "AMMONIA" in the last 168 hours.  Cardiac Enzymes: No results for input(s): "CKTOTAL", "CKMB", "CKMBINDEX", "TROPONINI" in the last 168 hours.  BNP (last 3 results) No results for input(s): "BNP" in the last 8760 hours.  ProBNP (last 3 results) No results for input(s): "PROBNP" in the last 8760 hours.  CBG: No  results for input(s): "GLUCAP" in the last 168 hours.  Lipase  No results found for: "LIPASE"   Urinalysis No results found for: "COLORURINE", "APPEARANCEUR", "LABSPEC", "PHURINE", "GLUCOSEU", "HGBUR", "BILIRUBINUR", "KETONESUR", "PROTEINUR", "UROBILINOGEN", "NITRITE", "LEUKOCYTESUR"   Drugs of Abuse  No results found for: "LABOPIA", "COCAINSCRNUR", "LABBENZ", "AMPHETMU", "THCU", "LABBARB"    Radiological Exams on Admission: CT ANGIO HEAD NECK W WO CM Result Date: 02/10/2023 CLINICAL DATA:  62 year old male with abnormal left side vision. Neurologic deficit. EXAM: CT ANGIOGRAPHY HEAD AND NECK WITH AND WITHOUT CONTRAST TECHNIQUE: Multidetector CT imaging of the head and neck was performed using the standard protocol during bolus administration of intravenous contrast. Multiplanar CT image reconstructions and MIPs were obtained to evaluate the vascular anatomy. Carotid stenosis measurements (when applicable) are obtained utilizing NASCET criteria, using the distal internal carotid diameter as the denominator. RADIATION DOSE REDUCTION: This exam was performed according to the departmental dose-optimization program which includes automated exposure control, adjustment of the mA and/or kV according to patient size and/or use of iterative reconstruction technique. CONTRAST:  75mL OMNIPAQUE IOHEXOL 350 MG/ML SOLN COMPARISON:  None Available. FINDINGS: CT HEAD Brain: Cerebral volume is within normal limits for age. Asymmetric hypodensity at the pole of the right occipital lobe on series 5 image 11 is questionably artifact (also sagittal image 30 of series 8. No midline shift, ventriculomegaly, mass effect, evidence of mass lesion, intracranial hemorrhage. No other cortically based acute infarction. Calvarium and skull base: Intact. No acute osseous abnormality identified. Paranasal sinuses: Mucoperiosteal thickening and bubbly opacity in the right maxillary sinus. Minor paranasal sinus mucosal thickening  otherwise. Tympanic cavities and mastoids are clear. Orbits: Visualized scalp soft tissues are within normal limits. Orbits soft tissues appears symmetric and negative. CTA NECK Skeleton: Absent dentition. Mild for age spine degeneration. No acute osseous abnormality identified. Upper chest: Negative; symmetric dependent pulmonary atelectasis. Other neck: Retained secretions in the nasopharynx, other bilateral neck soft tissue spaces are within normal limits. No cervical lymphadenopathy. Aortic arch: Calcified aortic atherosclerosis.  3 vessel arch. Right carotid system: Obscured right CCA origin from dense right subclavian venous contrast streak. Proximal to that the brachiocephalic artery demonstrates minimal atherosclerosis. Patent right CCA. Soft and calcified plaque at the right carotid bifurcation, right ICA origin and bulb. But no significant stenosis. Left carotid system: Patent left CCA with soft plaque mostly above the level of the larynx. Bulky soft and calcified plaque at the left ICA origin and bulb with high-grade proximal left ICA stenosis numerically estimated at 80 % with respect to the distal vessel. See series 12, image 150, series 11, image 122). The left ICA remains patent with additional calcified plaque at the bulb. Vertebral arteries: Right subclavian origin also obscured by right subclavian venous contrast streak. Normal right vertebral artery origin. Patent right vertebral artery to the skull base with no significant plaque or stenosis. Mild  proximal left subclavian plaque without stenosis. Soft plaque at the left vertebral artery origin results in moderate stenosis on series 12, image 151. Non dominant left vertebral artery otherwise is patent with no additional plaque or stenosis to the skull base. CTA HEAD Posterior circulation: Dominant right vertebral V4 segment is patent to the basilar without plaque or stenosis. There is left V4 mild plaque and stenosis near the left PICA origin  which remains patent. Patent left vertebrobasilar junction. Patent basilar artery. Patent SCA and PCA origins. Posterior communicating arteries are diminutive or absent. Bilateral PCA branches are patent. No right PCA branch occlusion is identified. There is mild left P2 segment irregularity. Anterior circulation: Both ICA siphons are patent. No left siphon plaque or stenosis. The left ophthalmic artery appears to remain patent at the orbital apex on coronal series 12, image 181 although the left ophthalmic artery origin is difficult to identify. On coronal images the right ophthalmic artery enhancement appears more robust. However, comparing axial images series 9, image 114 the ophthalmic artery enhancement appears symmetric. Overall, No ophthalmic artery abnormality by CTA. Right ICA siphon mild atherosclerosis without stenosis. Patent carotid termini. Normal MCA and ACA origins. Anterior communicating arteries within normal limits. Bilateral ACA branches are within normal limits. Left MCA M1 segment and bifurcation are patent without stenosis. Right MCA M1 segment and bifurcation are patent without stenosis. Bilateral MCA branches are within normal limits. Venous sinuses: Patent. Anatomic variants: Dominant right vertebral artery. Review of the MIP images confirms the above findings IMPRESSION: 1. Negative for large vessel occlusion or intracranial aneurysm. But positive for bulky Left Carotid bifurcation plaque with High-grade proximal Left ICA stenosis estimated at 80%, approaching a radiographic-string-sign. 2. Moderate stenosis of the left vertebral artery origin related to soft plaque. Mild distal left vertebral artery stenosis. 3. No definite acute intracranial abnormality. By head CT, suspect artifactual hypodensity at the right occipital pole. 4.  Aortic Atherosclerosis (ICD10-I70.0). Electronically Signed   By: Odessa Fleming M.D.   On: 02/10/2023 11:47     Signed, Lorin Glass, MD [facility] 02/10/2023

## 2023-02-10 NOTE — ED Notes (Signed)
Smoker. Endorses central pinhole vision thru L eye, largely no vision otherwise. R eye vision unremarkable. Endorses h/o frequent regular "ocular-type migraines", but has never been diagnosed or seen specialist. Denies other sx or deficits.

## 2023-02-10 NOTE — ED Notes (Signed)
CT in progress. RN with pt in CT.

## 2023-02-10 NOTE — ED Provider Notes (Signed)
[place] [facility] DEPARTMENT AT [facility] Provider Note   CSN: [id number] Arrival date & time: 02/10/23  0865     History  Chief Complaint  Patient presents with   Loss of Vision    [name] is a 62 y.o. male past medical history seen for hypertension, hyperlipidemia, anxiety, stigmatism presents with concern for sudden loss of vision in the left eye starting this morning.  He went to bed at 8:30-9 PM with normal vision.  He reports he woke up earlier this morning he is not sure exactly this time but may be around 7 or 8 AM with some dark spot on the left side but no total visual field loss patient reports that he went back to sleep and when he woke up he had total visual field loss on the left.  He reports there is 1 area where there is some mild blurring, he denies feeling like a curtain went down over his eyes.  He reports that it looks like he is looking at TV static on the left.  Notably patient reports that he has had some atypical migraine symptoms with visual field deficits recently but he has had no headache today.  HPI     Home Medications Prior to Admission medications   Medication Sig Start Date End Date Taking? Authorizing Provider  levothyroxine (SYNTHROID) 112 MCG tablet Take 112 mcg by mouth every morning. 12/22/22  Yes [provider]  rosuvastatin (CRESTOR) 5 MG tablet Take 5 mg by mouth daily. 12/22/22  Yes [provider]      Allergies    Patient has no known allergies.    Review of Systems   Review of Systems  All other systems reviewed and are negative.   Physical Exam Updated Vital Signs BP (!) 180/92   Pulse 67   Temp 98.1 F (36.7 C)   Resp 17   Ht 6\' 6"  (1.981 m)   Wt 119.3 kg   SpO2 98%   BMI 30.39 kg/m  Physical Exam Vitals and nursing note reviewed.  Constitutional:      General: He is not in acute distress.    Appearance: Normal appearance.  HENT:     Head: Normocephalic and atraumatic.  Eyes:      General:        Right eye: No discharge.        Left eye: No discharge.  Cardiovascular:     Rate and Rhythm: Normal rate and regular rhythm.     Heart sounds: No murmur heard.    No friction rub. No gallop.  Pulmonary:     Effort: Pulmonary effort is normal.     Breath sounds: Normal breath sounds.  Abdominal:     General: Bowel sounds are normal.     Palpations: Abdomen is soft.  Skin:    General: Skin is warm and dry.     Capillary Refill: Capillary refill takes less than 2 seconds.  Neurological:     Mental Status: He is alert and oriented to person, place, and time.     Comments: Cranial nerves II through XII grossly intact other than visual deficits noted below.  Intact finger-nose, intact heel-to-shin.  Romberg negative, gait normal.  Alert and oriented x3.  Moves all 4 limbs spontaneously, normal coordination.  No pronator drift.  Intact strength 5 out of 5 bilateral upper and lower extremities.  Total visual field loss on the left, but pupils do constrict directly and consensually. Difficult  to assess prior to dilation but suspect retina is pale on ophthalmoscopic exam of OS   Psychiatric:        Mood and Affect: Mood normal.        Behavior: Behavior normal.     ED Results / Procedures / Treatments   Labs (all labs ordered are listed, but only abnormal results are displayed) Labs Reviewed  CBC - Abnormal; Notable for the following components:      Result Value   WBC 12.1 (*)    All other components within normal limits  DIFFERENTIAL - Abnormal; Notable for the following components:   Neutro Abs 9.0 (*)    Abs Immature Granulocytes 0.09 (*)    All other components within normal limits  ETHANOL  PROTIME-INR  APTT  COMPREHENSIVE METABOLIC PANEL  RAPID URINE DRUG SCREEN, HOSP PERFORMED  URINALYSIS, ROUTINE W REFLEX MICROSCOPIC  SEDIMENTATION RATE  C-REACTIVE PROTEIN  I-STAT CHEM 8, ED    EKG None  Radiology CT ANGIO HEAD NECK W WO CM Result Date:  02/10/2023 CLINICAL DATA:  62 year old male with abnormal left side vision. Neurologic deficit. EXAM: CT ANGIOGRAPHY HEAD AND NECK WITH AND WITHOUT CONTRAST TECHNIQUE: Multidetector CT imaging of the head and neck was performed using the standard protocol during bolus administration of intravenous contrast. Multiplanar CT image reconstructions and MIPs were obtained to evaluate the vascular anatomy. Carotid stenosis measurements (when applicable) are obtained utilizing NASCET criteria, using the distal internal carotid diameter as the denominator. RADIATION DOSE REDUCTION: This exam was performed according to the departmental dose-optimization program which includes automated exposure control, adjustment of the mA and/or kV according to patient size and/or use of iterative reconstruction technique. CONTRAST:  75mL OMNIPAQUE IOHEXOL 350 MG/ML SOLN COMPARISON:  None Available. FINDINGS: CT HEAD Brain: Cerebral volume is within normal limits for age. Asymmetric hypodensity at the pole of the right occipital lobe on series 5 image 11 is questionably artifact (also sagittal image 30 of series 8. No midline shift, ventriculomegaly, mass effect, evidence of mass lesion, intracranial hemorrhage. No other cortically based acute infarction. Calvarium and skull base: Intact. No acute osseous abnormality identified. Paranasal sinuses: Mucoperiosteal thickening and bubbly opacity in the right maxillary sinus. Minor paranasal sinus mucosal thickening otherwise. Tympanic cavities and mastoids are clear. Orbits: Visualized scalp soft tissues are within normal limits. Orbits soft tissues appears symmetric and negative. CTA NECK Skeleton: Absent dentition. Mild for age spine degeneration. No acute osseous abnormality identified. Upper chest: Negative; symmetric dependent pulmonary atelectasis. Other neck: Retained secretions in the nasopharynx, other bilateral neck soft tissue spaces are within normal limits. No cervical  lymphadenopathy. Aortic arch: Calcified aortic atherosclerosis.  3 vessel arch. Right carotid system: Obscured right CCA origin from dense right subclavian venous contrast streak. Proximal to that the brachiocephalic artery demonstrates minimal atherosclerosis. Patent right CCA. Soft and calcified plaque at the right carotid bifurcation, right ICA origin and bulb. But no significant stenosis. Left carotid system: Patent left CCA with soft plaque mostly above the level of the larynx. Bulky soft and calcified plaque at the left ICA origin and bulb with high-grade proximal left ICA stenosis numerically estimated at 80 % with respect to the distal vessel. See series 12, image 150, series 11, image 122). The left ICA remains patent with additional calcified plaque at the bulb. Vertebral arteries: Right subclavian origin also obscured by right subclavian venous contrast streak. Normal right vertebral artery origin. Patent right vertebral artery to the skull base with no significant plaque or stenosis.  Mild proximal left subclavian plaque without stenosis. Soft plaque at the left vertebral artery origin results in moderate stenosis on series 12, image 151. Non dominant left vertebral artery otherwise is patent with no additional plaque or stenosis to the skull base. CTA HEAD Posterior circulation: Dominant right vertebral V4 segment is patent to the basilar without plaque or stenosis. There is left V4 mild plaque and stenosis near the left PICA origin which remains patent. Patent left vertebrobasilar junction. Patent basilar artery. Patent SCA and PCA origins. Posterior communicating arteries are diminutive or absent. Bilateral PCA branches are patent. No right PCA branch occlusion is identified. There is mild left P2 segment irregularity. Anterior circulation: Both ICA siphons are patent. No left siphon plaque or stenosis. The left ophthalmic artery appears to remain patent at the orbital apex on coronal series 12, image  181 although the left ophthalmic artery origin is difficult to identify. On coronal images the right ophthalmic artery enhancement appears more robust. However, comparing axial images series 9, image 114 the ophthalmic artery enhancement appears symmetric. Overall, No ophthalmic artery abnormality by CTA. Right ICA siphon mild atherosclerosis without stenosis. Patent carotid termini. Normal MCA and ACA origins. Anterior communicating arteries within normal limits. Bilateral ACA branches are within normal limits. Left MCA M1 segment and bifurcation are patent without stenosis. Right MCA M1 segment and bifurcation are patent without stenosis. Bilateral MCA branches are within normal limits. Venous sinuses: Patent. Anatomic variants: Dominant right vertebral artery. Review of the MIP images confirms the above findings IMPRESSION: 1. Negative for large vessel occlusion or intracranial aneurysm. But positive for bulky Left Carotid bifurcation plaque with High-grade proximal Left ICA stenosis estimated at 80%, approaching a radiographic-string-sign. 2. Moderate stenosis of the left vertebral artery origin related to soft plaque. Mild distal left vertebral artery stenosis. 3. No definite acute intracranial abnormality. By head CT, suspect artifactual hypodensity at the right occipital pole. 4.  Aortic Atherosclerosis (ICD10-I70.0). Electronically Signed   By: Odessa Fleming M.D.   On: 02/10/2023 11:47    Procedures Procedures    Medications Ordered in ED Medications  aspirin chewable tablet 81 mg (has no administration in time range)  clopidogrel (PLAVIX) tablet 75 mg (has no administration in time range)  iohexol (OMNIPAQUE) 350 MG/ML injection 75 mL (75 mLs Intravenous Contrast Given 02/10/23 1132)    ED Course/ Medical Decision Making/ A&P                                 Medical Decision Making Amount and/or Complexity of Data Reviewed Labs: ordered. Radiology: ordered.  Risk OTC drugs. Prescription  drug management. Decision regarding hospitalization.   This patient is a 62 y.o. male who presents to the ED for concern of sudden onset visual loss on the left, this involves an extensive number of treatment options, and is a complaint that carries with it a high risk of complications and morbidity. The emergent differential diagnosis prior to evaluation includes, but is not limited to, stroke, CRAO, retinal detachment, atypical migraine presentation. This is not an exhaustive differential.   Past Medical History / Co-morbidities / Social History: Hypertension, anxiety, arthritis, hyperlipidemia  Physical Exam: Physical exam performed. The pertinent findings include: Quite hypertensive in the emergency department, blood pressure max 203/96.  Vital signs otherwise stable, total left-sided visual field deficit, suspected pale retina on bedside ophthalmoscopic exam.  Lab Tests: I ordered, and personally interpreted labs.  The pertinent results  include: CMP unremarkable, CBC notable for mild leukocytosis, WBC, 12.1   Imaging Studies: I ordered imaging studies including CTA head neck. I independently visualized and interpreted imaging which showed 1. Negative for large vessel occlusion or intracranial aneurysm. But positive for bulky Left Carotid bifurcation plaque with High-grade proximal Left ICA stenosis estimated at 80%, approaching a radiographic-string-sign. 2. Moderate stenosis of the left vertebral artery origin related to soft plaque. Mild distal left vertebral artery stenosis. 3. No definite acute intracranial abnormality. By head CT, suspect artifactual hypodensity at the right occipital pole. 4. Aortic Atherosclerosis (ICD10-I70.0). I agree with the radiologist interpretation.   Cardiac Monitoring:  The patient was maintained on a cardiac monitor.  My attending physician Dr. Karene Fry viewed and interpreted the cardiac monitored which showed an underlying rhythm of: NSR. I agree with  this interpretation.   Medications: I ordered medication including aspirin and Plavix for stroke  Consultations Obtained: I requested consultation with the ophtlamologist, spoke with dr Sherryll Burger who assessed patient at bedside, diagnosed CRAO, and recommendations in his consult note, recommendations are appreciated.  I spoke with the neurologist, Dr. Otelia Limes who will manage this patient for new stroke, and agrees with ophthalmology recommendations, spoke with the vascular surgeon, Dr. Lenell Antu who will take patient for endarterectomy during his admission, okay with aspirin and Plavix at this time.  Will consult with the hospitalist, spoke with Dr. Melina Schools,  and discussed lab and imaging findings as well as pertinent plan - they recommend: Admission for new stroke, CRAO   Disposition: After consideration of the diagnostic results and the patients response to treatment, I feel that patient would benefit from admission at this time with plan as above  I discussed this case with my attending physician Dr. Karene Fry who cosigned this note including patient's presenting symptoms, physical exam, and planned diagnostics and interventions. Attending physician stated agreement with plan or made changes to plan which were implemented.    Final Clinical Impression(s) / ED Diagnoses Final diagnoses:  Central retinal artery occlusion of left eye    Rx / DC Orders ED Discharge Orders     None         Olene Floss, PA-C 02/10/23 1344    Ernie Avena, MD 02/10/23 1407

## 2023-02-10 NOTE — ED Notes (Signed)
Patient transported to MRI 

## 2023-02-10 NOTE — ED Notes (Signed)
ED TO INPATIENT HANDOFF REPORT  ED Nurse Name and Phone #: gabe Tamantha Saline [phone]  S Name/Age/Gender [name] 62 y.o. male Room/Bed: 005C/005C  Code Status   Code Status: Full Code  Home/SNF/Other Home Patient oriented to: self, place, time, and situation Is this baseline? Yes   Triage Complete: Triage complete  Chief Complaint CRAO (central retinal artery occlusion) [H34.10]  Triage Note Reports went to bed last night around 830-9pm and woke up this morning with loss of vision in left eye.  Reports its like looking at a tv with static.  Denies headache weakness.   Allergies No Known Allergies  Level of Care/Admitting Diagnosis ED Disposition     ED Disposition  Admit   Condition  --   Comment  Hospital Area: [facility] [[id number]]  Level of Care: Telemetry Medical [104]  May place patient in observation at [facility] or [facility] if equivalent level of care is available:: No  Covid Evaluation: Asymptomatic - no recent exposure (last 10 days) testing not required  Diagnosis: CRAO (central retinal artery occlusion) [409811]  Admitting Physician: Lorin Glass [[id number]]  Attending Physician: Lorin Glass [[id number]]          B Medical/Surgery History Past Medical History:  Diagnosis Date   Anxiety    Arthritis    in back    Astigmatism    Back pain    Hyperlipidemia    past hx    Hypertension    Inguinal hernia of left side without obstruction or gangrene 07/15/2019   Thyroid disease    Past Surgical History:  Procedure Laterality Date   APPENDECTOMY     BACK SURGERY     PLEURAL SCARIFICATION       A IV Location/Drains/Wounds Patient Lines/Drains/Airways Status     Active Line/Drains/Airways     Name Placement date Placement time Site Days   Peripheral IV 02/10/23 18 G 1" Right;Anterior;Proximal Forearm 02/10/23  1047  Forearm  less than 1            Intake/Output Last 24 hours No intake or output data in the 24  hours ending 02/10/23 1816  Labs/Imaging Results for orders placed or performed during the hospital encounter of 02/10/23 (from the past 48 hours)  Ethanol     Status: None   Collection Time: 02/10/23 10:16 AM  Result Value Ref Range   Alcohol, Ethyl (B) <10 <10 mg/dL    Comment: (NOTE) Lowest detectable limit for serum alcohol is 10 mg/dL.  For medical purposes only. Performed at [facility], [address], [place], [place] [address]   Protime-INR     Status: None   Collection Time: 02/10/23 10:16 AM  Result Value Ref Range   Prothrombin Time 13.6 11.4 - 15.2 seconds   INR 1.0 0.8 - 1.2    Comment: (NOTE) INR goal varies based on device and disease states. Performed at [facility], [address], [place], [place] [address]   APTT     Status: None   Collection Time: 02/10/23 10:16 AM  Result Value Ref Range   aPTT 31 24 - 36 seconds    Comment: Performed at [facility], [address] [place], [place] [address]  CBC     Status: Abnormal   Collection Time: 02/10/23 10:16 AM  Result Value Ref Range   WBC 12.1 (H) 4.0 - 10.5 K/uL   RBC 4.74 4.22 - 5.81 MIL/uL   Hemoglobin 14.3  13.0 - 17.0 g/dL   HCT 64.3 32.9 - 51.8 %   MCV 89.5 80.0 - 100.0 fL   MCH 30.2 26.0 - 34.0 pg   MCHC 33.7 30.0 - 36.0 g/dL   RDW 84.1 66.0 - 63.0 %   Platelets 207 150 - 400 K/uL   nRBC 0.0 0.0 - 0.2 %    Comment: Performed at [facility], [address], [place], [place] [address]  Differential     Status: Abnormal   Collection Time: 02/10/23 10:16 AM  Result Value Ref Range   Neutrophils Relative % 74 %   Neutro Abs 9.0 (H) 1.7 - 7.7 K/uL   Lymphocytes Relative 16 %   Lymphs Abs 1.9 0.7 - 4.0 K/uL   Monocytes Relative 6 %   Monocytes Absolute 0.7 0.1 - 1.0 K/uL   Eosinophils Relative 2 %   Eosinophils Absolute 0.2 0.0 - 0.5 K/uL   Basophils Relative 1 %   Basophils Absolute 0.1 0.0 - 0.1 K/uL   Immature Granulocytes 1 %   Abs Immature Granulocytes 0.09 (H)  0.00 - 0.07 K/uL    Comment: Performed at [facility], [address] [place], [place] [address]  Comprehensive metabolic panel     Status: None   Collection Time: 02/10/23 10:16 AM  Result Value Ref Range   Sodium 137 135 - 145 mmol/L   Potassium 4.0 3.5 - 5.1 mmol/L   Chloride 106 98 - 111 mmol/L   CO2 23 22 - 32 mmol/L   Glucose, Bld 96 70 - 99 mg/dL    Comment: Glucose reference range applies only to samples taken after fasting for at least 8 hours.   BUN 15 6 - 20 mg/dL   Creatinine, Ser 5.73 0.61 - 1.24 mg/dL   Calcium 9.0 8.9 - 22.0 mg/dL   Total Protein 7.1 6.5 - 8.1 g/dL   Albumin 3.9 3.5 - 5.0 g/dL   AST 18 15 - 41 U/L   ALT 16 0 - 44 U/L   Alkaline Phosphatase 88 38 - 126 U/L   Total Bilirubin 0.7 <1.2 mg/dL   GFR, Estimated >25 >42 mL/min    Comment: (NOTE) Calculated using the CKD-EPI Creatinine Equation (2021)    Anion gap 8 5 - 15    Comment: Performed at [facility], [address], [place], [place] [address]  Urine rapid drug screen (hosp performed)     Status: None   Collection Time: 02/10/23  3:30 PM  Result Value Ref Range   Opiates NONE DETECTED NONE DETECTED   Cocaine NONE DETECTED NONE DETECTED   Benzodiazepines NONE DETECTED NONE DETECTED   Amphetamines NONE DETECTED NONE DETECTED   Tetrahydrocannabinol NONE DETECTED NONE DETECTED   Barbiturates NONE DETECTED NONE DETECTED    Comment: (NOTE) DRUG SCREEN FOR MEDICAL PURPOSES ONLY.  IF CONFIRMATION IS NEEDED FOR ANY PURPOSE, NOTIFY LAB WITHIN 5 DAYS.  LOWEST DETECTABLE LIMITS FOR URINE DRUG SCREEN Drug Class                     Cutoff (ng/mL) Amphetamine and metabolites    1000 Barbiturate and metabolites    200 Benzodiazepine                 200 Opiates and metabolites        300 Cocaine and metabolites        300 THC  50 Performed at [facility], [address] [place], [place] [address]   Urinalysis, Routine w reflex microscopic -Urine,  Clean Catch     Status: Abnormal   Collection Time: 02/10/23  3:30 PM  Result Value Ref Range   Color, Urine STRAW (A) YELLOW   APPearance CLEAR CLEAR   Specific Gravity, Urine 1.012 1.005 - 1.030   pH 6.0 5.0 - 8.0   Glucose, UA NEGATIVE NEGATIVE mg/dL   Hgb urine dipstick NEGATIVE NEGATIVE   Bilirubin Urine NEGATIVE NEGATIVE   Ketones, ur NEGATIVE NEGATIVE mg/dL   Protein, ur NEGATIVE NEGATIVE mg/dL   Nitrite NEGATIVE NEGATIVE   Leukocytes,Ua NEGATIVE NEGATIVE    Comment: Performed at [facility], [address], [place], [place] [address]  Sedimentation rate     Status: None   Collection Time: 02/10/23  3:45 PM  Result Value Ref Range   Sed Rate 9 0 - 16 mm/hr    Comment: Performed at [facility], [address] [place], [place] [address]  C-reactive protein     Status: None   Collection Time: 02/10/23  3:45 PM  Result Value Ref Range   CRP 0.5 <1.0 mg/dL    Comment: Performed at [facility], [address], [place], [place] [address]   MR BRAIN WO CONTRAST Result Date: 02/10/2023 CLINICAL DATA:  Neuro deficit, acute, stroke suspected. Abnormal vision in the left eye. EXAM: MRI HEAD WITHOUT CONTRAST TECHNIQUE: Multiplanar, multiecho pulse sequences of the brain and surrounding structures were obtained without intravenous contrast. COMPARISON:  MR scratched at CT angio head and neck 02/10/2023 FINDINGS: Brain: No acute infarct, hemorrhage, or mass lesion is present. Scattered periventricular and subcortical T2 hyperintensities are mildly advanced for age. The ventricles are of normal size. No significant extraaxial fluid collection is present. Deep brain nuclei are within normal limits. The brainstem and cerebellum are within normal limits. The internal auditory canals are within normal limits. Midline structures are within normal limits. Vascular: Flow is present in the major intracranial arteries. Skull and upper cervical spine: The craniocervical junction is  normal. Upper cervical spine is within normal limits. Marrow signal is unremarkable. Sinuses/Orbits: Fluid level is present within an atelectatic right maxillary sinus. Paranasal sinuses are otherwise clear. Bilateral mastoid effusions are present. No obstructing nasopharyngeal lesion is present. The globes and orbits are within normal limits. IMPRESSION: 1. No acute intracranial abnormality. 2. Scattered periventricular and subcortical T2 hyperintensities are mildly advanced for age. The finding is nonspecific but can be seen in the setting of chronic microvascular ischemia, a demyelinating process such as multiple sclerosis, vasculitis, complicated migraine headaches, or as the sequelae of a prior infectious or inflammatory process. 3. Bilateral mastoid effusions. No obstructing nasopharyngeal lesion is present. 4. Fluid level within an atelectatic right maxillary sinus. This likely reflects acute on chronic sinusitis. Electronically Signed   By: Marin Roberts M.D.   On: 02/10/2023 15:50   CT ANGIO HEAD NECK W WO CM Result Date: 02/10/2023 CLINICAL DATA:  62 year old male with abnormal left side vision. Neurologic deficit. EXAM: CT ANGIOGRAPHY HEAD AND NECK WITH AND WITHOUT CONTRAST TECHNIQUE: Multidetector CT imaging of the head and neck was performed using the standard protocol during bolus administration of intravenous contrast. Multiplanar CT image reconstructions and MIPs were obtained to evaluate the vascular anatomy. Carotid stenosis measurements (when applicable) are obtained utilizing NASCET criteria, using the distal internal carotid diameter as the denominator. RADIATION DOSE REDUCTION: This exam was performed  according to the departmental dose-optimization program which includes automated exposure control, adjustment of the mA and/or kV according to patient size and/or use of iterative reconstruction technique. CONTRAST:  75mL OMNIPAQUE IOHEXOL 350 MG/ML SOLN COMPARISON:  None Available.  FINDINGS: CT HEAD Brain: Cerebral volume is within normal limits for age. Asymmetric hypodensity at the pole of the right occipital lobe on series 5 image 11 is questionably artifact (also sagittal image 30 of series 8. No midline shift, ventriculomegaly, mass effect, evidence of mass lesion, intracranial hemorrhage. No other cortically based acute infarction. Calvarium and skull base: Intact. No acute osseous abnormality identified. Paranasal sinuses: Mucoperiosteal thickening and bubbly opacity in the right maxillary sinus. Minor paranasal sinus mucosal thickening otherwise. Tympanic cavities and mastoids are clear. Orbits: Visualized scalp soft tissues are within normal limits. Orbits soft tissues appears symmetric and negative. CTA NECK Skeleton: Absent dentition. Mild for age spine degeneration. No acute osseous abnormality identified. Upper chest: Negative; symmetric dependent pulmonary atelectasis. Other neck: Retained secretions in the nasopharynx, other bilateral neck soft tissue spaces are within normal limits. No cervical lymphadenopathy. Aortic arch: Calcified aortic atherosclerosis.  3 vessel arch. Right carotid system: Obscured right CCA origin from dense right subclavian venous contrast streak. Proximal to that the brachiocephalic artery demonstrates minimal atherosclerosis. Patent right CCA. Soft and calcified plaque at the right carotid bifurcation, right ICA origin and bulb. But no significant stenosis. Left carotid system: Patent left CCA with soft plaque mostly above the level of the larynx. Bulky soft and calcified plaque at the left ICA origin and bulb with high-grade proximal left ICA stenosis numerically estimated at 80 % with respect to the distal vessel. See series 12, image 150, series 11, image 122). The left ICA remains patent with additional calcified plaque at the bulb. Vertebral arteries: Right subclavian origin also obscured by right subclavian venous contrast streak. Normal right  vertebral artery origin. Patent right vertebral artery to the skull base with no significant plaque or stenosis. Mild proximal left subclavian plaque without stenosis. Soft plaque at the left vertebral artery origin results in moderate stenosis on series 12, image 151. Non dominant left vertebral artery otherwise is patent with no additional plaque or stenosis to the skull base. CTA HEAD Posterior circulation: Dominant right vertebral V4 segment is patent to the basilar without plaque or stenosis. There is left V4 mild plaque and stenosis near the left PICA origin which remains patent. Patent left vertebrobasilar junction. Patent basilar artery. Patent SCA and PCA origins. Posterior communicating arteries are diminutive or absent. Bilateral PCA branches are patent. No right PCA branch occlusion is identified. There is mild left P2 segment irregularity. Anterior circulation: Both ICA siphons are patent. No left siphon plaque or stenosis. The left ophthalmic artery appears to remain patent at the orbital apex on coronal series 12, image 181 although the left ophthalmic artery origin is difficult to identify. On coronal images the right ophthalmic artery enhancement appears more robust. However, comparing axial images series 9, image 114 the ophthalmic artery enhancement appears symmetric. Overall, No ophthalmic artery abnormality by CTA. Right ICA siphon mild atherosclerosis without stenosis. Patent carotid termini. Normal MCA and ACA origins. Anterior communicating arteries within normal limits. Bilateral ACA branches are within normal limits. Left MCA M1 segment and bifurcation are patent without stenosis. Right MCA M1 segment and bifurcation are patent without stenosis. Bilateral MCA branches are within normal limits. Venous sinuses: Patent. Anatomic variants: Dominant right vertebral artery. Review of the MIP images confirms the above findings IMPRESSION:  1. Negative for large vessel occlusion or intracranial  aneurysm. But positive for bulky Left Carotid bifurcation plaque with High-grade proximal Left ICA stenosis estimated at 80%, approaching a radiographic-string-sign. 2. Moderate stenosis of the left vertebral artery origin related to soft plaque. Mild distal left vertebral artery stenosis. 3. No definite acute intracranial abnormality. By head CT, suspect artifactual hypodensity at the right occipital pole. 4.  Aortic Atherosclerosis (ICD10-I70.0). Electronically Signed   By: Odessa Fleming M.D.   On: 02/10/2023 11:47    Pending Labs Unresulted Labs (From admission, onward)     Start     Ordered   02/11/23 0500  Lipid panel  (Labs)  Tomorrow morning,   R       Comments: Fasting    02/10/23 1349   02/11/23 0500  HIV Antibody (routine testing w rflx)  (HIV Antibody (Routine testing w reflex) panel)  Tomorrow morning,   R        02/10/23 1349   02/11/23 0500  Basic metabolic panel  Tomorrow morning,   R        02/10/23 1349   02/11/23 0500  CBC  Tomorrow morning,   R        02/10/23 1349   02/10/23 1349  Hemoglobin A1c  (Labs)  Once,   R       Comments: To assess prior glycemic control    02/10/23 1349            Vitals/Pain Today's Vitals   02/10/23 1545 02/10/23 1600 02/10/23 1615 02/10/23 1648  BP: (!) 190/76 (!) 179/90 (!) 170/89 (!) 177/74  Pulse: 63 64 66 63  Resp: 17 14 18 12   Temp:    97.6 F (36.4 C)  TempSrc:    Oral  SpO2: 100% 100% 100% 100%  Weight:      Height:      PainSc:        Isolation Precautions No active isolations  Medications Medications   stroke: early stages of recovery book (has no administration in time range)  acetaminophen (TYLENOL) tablet 650 mg (has no administration in time range)    Or  acetaminophen (TYLENOL) suppository 650 mg (has no administration in time range)  polyethylene glycol (MIRALAX / GLYCOLAX) packet 17 g (has no administration in time range)  bisacodyl (DULCOLAX) EC tablet 5 mg (has no administration in time range)   albuterol (PROVENTIL) (2.5 MG/3ML) 0.083% nebulizer solution 2.5 mg (has no administration in time range)  hydrALAZINE (APRESOLINE) injection 10 mg (10 mg Intravenous Given 02/10/23 1556)  enoxaparin (LOVENOX) injection 40 mg (40 mg Subcutaneous Given 02/10/23 1539)  levothyroxine (SYNTHROID) tablet 112 mcg (has no administration in time range)  rosuvastatin (CRESTOR) tablet 5 mg (has no administration in time range)  nicotine (NICODERM CQ - dosed in mg/24 hours) patch 21 mg (21 mg Transdermal Patch Applied 02/10/23 1554)  aspirin EC tablet 81 mg (81 mg Oral Given 02/10/23 1754)  clopidogrel (PLAVIX) tablet 75 mg (75 mg Oral Given 02/10/23 1800)  iohexol (OMNIPAQUE) 350 MG/ML injection 75 mL (75 mLs Intravenous Contrast Given 02/10/23 1132)  aspirin chewable tablet 81 mg (81 mg Oral Given 02/10/23 1540)  clopidogrel (PLAVIX) tablet 75 mg (75 mg Oral Given 02/10/23 1540)    Mobility walks     Focused Assessments Neuro Assessment Handoff:  Swallow screen pass? Yes    NIH Stroke Scale  Dizziness Present: No Headache Present: Yes Interval: Initial Level of Consciousness (1a.)   : Alert, keenly responsive LOC  Questions (1b. )   : Answers both questions correctly LOC Commands (1c. )   : Performs both tasks correctly Best Gaze (2. )  : Normal Visual (3. )  : Partial hemianopia (L vision loss OS) Facial Palsy (4. )    : Normal symmetrical movements Motor Arm, Left (5a. )   : No drift Motor Arm, Right (5b. ) : No drift Motor Leg, Left (6a. )  : No drift Motor Leg, Right (6b. ) : No drift Limb Ataxia (7. ): Absent Sensory (8. )  : Normal, no sensory loss Best Language (9. )  : No aphasia Dysarthria (10. ): Normal Extinction/Inattention (11.)   : No Abnormality Complete NIHSS TOTAL: 1     Neuro Assessment: Exceptions to WDL Neuro Checks:   Initial (02/10/23 1048)  Has TPA been given? No If patient is a Neuro Trauma and patient is going to OR before floor call report to 4N  Charge nurse: [phone] or [phone]   R Recommendations: See Admitting Provider Note  Report given to:   Additional Notes:

## 2023-02-10 NOTE — Consult Note (Addendum)
NEUROLOGY CONSULT NOTE   Date of service: February 10, 2023 Patient Name: [name] MRN:  [id number] DOB:  May 05, 1960 Chief Complaint: "left eye vision loss" Requesting Provider: Lorin Glass, MD  History of Present Illness  [name] is a 62 y.o. male who has a past medical history of Anxiety, Arthritis, Astigmatism, Back pain, Hyperlipidemia, Hypertension, Inguinal hernia of left side without obstruction or gangrene (07/15/2019), and Thyroid disease.  He presents with painless vision loss in his left eye.  He went to bed last night between 830 and 9 PM in his normal state of health.  When he woke up this morning around 7 AM he was fine. He went back to sleep for a short nap and then woke up again at 8 AM. At that time he noted a dark spot in his vision when looking at his surroundings with his left eye, but normal vision with right eye while left eye was closed. The left monocular dark spot then spread throughtout the vision of his left eye over the course of 5-10 minutes affecting all of the visual fields OS, except for a small "keyhole" of preserved vision near the center.    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 CT angio shows left ICA stenosis of 80% and Vascular Surgery has been consulted (Dr. Lenell Antu).  They do plan to do a CEA this hospitalization.    He denies weakness, numbness, tingling, headache, difficulty speaking, nausea, vomiting or recent illness.  He does currently smoke more than 1 pack of cigarettes per day.  He denies eating a healthy diet.  He currently takes Synthroid and rosuvastatin 5 mg.  His blood pressure is currently elevated with a systolic between 409-811, Glucose is 96.  MRI is negative for any acute intracranial abnormality, he does have some chronic small vessel disease noted on the T2-weighted images.  Ophthalmology has seen him in the ED and performed a dilated retinal exam. 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 Given that he is now functionally monocular they would avoid any GLP-1 medications such as Ozempic due to potentially increased risk for NAION. Also recommending outpatient follow-up in 2-4 weeks w/ Ophthalmology.    ROS  Comprehensive ROS performed and pertinent positives documented in HPI   Past History   Past Medical History:  Diagnosis Date   Anxiety    Arthritis    in back    Astigmatism    Back pain    Hyperlipidemia    past hx    Hypertension    Inguinal hernia of left side without obstruction or gangrene 07/15/2019   Thyroid disease     Past Surgical History:  Procedure Laterality Date   APPENDECTOMY     BACK SURGERY     PLEURAL SCARIFICATION      Family History: Family History  Problem Relation Age of Onset   Diabetes Mother    Colon cancer Neg Hx    Pancreatic cancer Neg Hx    Esophageal cancer Neg Hx    Stomach cancer Neg Hx    Liver disease Neg Hx    Colon polyps Neg Hx    Rectal  cancer Neg Hx     Social History  reports that he has been smoking cigarettes. He has a 30 pack-year smoking history. He has never used smokeless tobacco. He reports that he does not currently use alcohol. He reports that he does not use drugs.  No Known Allergies  Medications   Current Facility-Administered Medications:    [START ON 02/11/2023]  stroke: early stages of recovery book, , Does not apply, Once, Dahal, Binaya, MD   acetaminophen (TYLENOL) tablet 650 mg, 650 mg, Oral, Q6H PRN **OR** acetaminophen (TYLENOL) suppository 650 mg, 650 mg, Rectal, Q6H PRN, Dahal, Binaya, MD   albuterol (PROVENTIL) (2.5 MG/3ML) 0.083% nebulizer solution 2.5 mg, 2.5 mg, Nebulization, Q6H PRN, Dahal,  Binaya, MD   aspirin chewable tablet 81 mg, 81 mg, Oral, Once, Prosperi, Christian H, PA-C   bisacodyl (DULCOLAX) EC tablet 5 mg, 5 mg, Oral, Daily PRN, Dahal, Binaya, MD   clopidogrel (PLAVIX) tablet 75 mg, 75 mg, Oral, Once, Prosperi, Christian H, PA-C   enoxaparin (LOVENOX) injection 40 mg, 40 mg, Subcutaneous, Q24H, Dahal, Binaya, MD   hydrALAZINE (APRESOLINE) injection 10 mg, 10 mg, Intravenous, Q6H PRN, Dahal, Binaya, MD   polyethylene glycol (MIRALAX / GLYCOLAX) packet 17 g, 17 g, Oral, Daily PRN, Dahal, Binaya, MD  Current Outpatient Medications:    levothyroxine (SYNTHROID) 112 MCG tablet, Take 112 mcg by mouth every morning., Disp: , Rfl:    rosuvastatin (CRESTOR) 5 MG tablet, Take 5 mg by mouth daily., Disp: , Rfl:   Vitals   Vitals:   03-11-2023 1115 03-11-2023 1116 Mar 11, 2023 1230 2023/03/11 1336  BP: (!) 203/96  (!) 180/92   Pulse: 91 86 67   Resp: 19 19 17    Temp:    98.1 F (36.7 C)  TempSrc:      SpO2: 100% 100% 98%   Weight:      Height:        Body mass index is 30.39 kg/m.  Physical Exam   Physical Exam  HEENT-  [place]/AT    Lungs- Respirations unlabored Extremities- No edema  Neurological Examination Mental Status: Alert, fully oriented, thought content appropriate.  Speech fluent without evidence of aphasia.  Able to follow all commands without difficulty. Cranial Nerves: II:  OS: Absent vision except for a small central spot of preserved vision through which he can perceive objects and movement.  OD: Visual fields intact in all 4 quadrants Pupils are dilated to 7 mm bilaterally in ambient light. Left pupil unreactive to direct light with RAPD, right pupil briskly reactive to 6 mm.  III,IV, VI: No ptosis. EOMI.  V: Temp sensation equal bilaterally  VII: Smile symmetric VIII: Hearing intact to voice IX,X: No hoarseness XI: Symmetric shoulder shrug XII: Midline tongue extension Motor: BUE 5/5 proximally and distally BLE 5/5 proximally and distally  No  pronator drift.  Sensory: Temp and light touch intact throughout, bilaterally. No extinction to DSS.  Deep Tendon Reflexes: 2+ and symmetric throughout Cerebellar: No ataxia with FNF bilaterally  Gait: Deferred  Labs/Imaging/Neurodiagnostic studies   CBC:  Recent Labs  Lab Mar 11, 2023 1016  WBC 12.1*  NEUTROABS 9.0*  HGB 14.3  HCT 42.4  MCV 89.5  PLT 207   Basic Metabolic Panel:  Lab Results  Component Value Date   NA 137 03/11/23   K 4.0 03-11-23   CO2 23 2023/03/11   GLUCOSE 96 11-Mar-2023   BUN 15 Mar 11, 2023   CREATININE 1.07 03-11-2023   CALCIUM 9.0 03-11-23   GFRNONAA >60 2023-03-11  Lipid Panel: No results found for: "LDLCALC" HgbA1c: No results found for: "HGBA1C" Urine Drug Screen: No results found for: "LABOPIA", "COCAINSCRNUR", "LABBENZ", "AMPHETMU", "THCU", "LABBARB"  Alcohol Level     Component Value Date/Time   ETH <10 02/10/2023 1016   INR  Lab Results  Component Value Date   INR 1.0 02/10/2023   APTT  Lab Results  Component Value Date   APTT 31 02/10/2023   MRI Brain 1. No acute intracranial abnormality. 2. Scattered periventricular and subcortical T2 hyperintensities are mildly advanced for age. The finding is nonspecific but can be seen in the setting of chronic microvascular ischemia, a demyelinating process such as multiple sclerosis, vasculitis, complicated migraine headaches, or as the sequelae of a prior infectious or inflammatory process. 3. Bilateral mastoid effusions. No obstructing nasopharyngeal lesion is present. 4. Fluid level within an atelectatic right maxillary sinus. This likely reflects acute on chronic sinusitis.  CT Angio Head and Neck  1. Negative for large vessel occlusion or intracranial aneurysm. But positive for bulky Left Carotid bifurcation plaque with High-grade proximal Left ICA stenosis estimated at 80%, approaching a radiographic-string-sign. 2. Moderate stenosis of the left vertebral artery origin related to  soft plaque. Mild distal left vertebral artery stenosis. 3. No definite acute intracranial abnormality. By head CT, suspect artifactual hypodensity at the right occipital pole. 4.  Aortic Atherosclerosis (ICD10-I70.0).  ASSESSMENT  [name] is a 62 y.o. male  has a past medical history of Anxiety, Arthritis, Astigmatism, Back pain, Hyperlipidemia, Hypertension, Inguinal hernia of left side without obstruction or gangrene (07/15/2019), active cigarette smoker and Thyroid disease, presenting with painless acute vision loss in his left eye. 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  - CT angio shows left ICA stenosis of 80% approaching a radiographic string sign.  - MRI negative for acute stroke, with chronic small vessel ischemic changes noted.  - Vascular Surgery does plan to do a left CEA this hospitalization.   - The patient will need a full stroke workup which has been ordered.    RECOMMENDATIONS  - Stroke protocol work up and management - HgbA1c, fasting lipid panel - ESR, CRP, PLT counts - Frequent neuro checks - Echocardiogram - Prophylactic therapy-Antiplatelet med: Aspirin 81mg  and Plavix 75mg  - Risk factor modification - Telemetry monitoring - PT consult, OT consult, Speech consult - Carotid ultrasound - Statin - BP management per standard protocol. No indication for permissive HTN.  - Stroke team to follow - Vascular Surgery is following- plan for CEA this admission  ______________________________________________________________________    Dessa Phi, Nicholas Arts, MD [facility]

## 2023-02-10 NOTE — ED Notes (Signed)
EDPA at [facility], alert, NAD, calm, interactive.

## 2023-02-10 NOTE — ED Notes (Signed)
Pt to MRI

## 2023-02-10 NOTE — ED Notes (Signed)
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

## 2023-02-10 NOTE — Consult Note (Signed)
CC:  Chief Complaint  Patient presents with   Loss of Vision    HPI: [name] is a 62 y.o. male w/ POH of RE and PMH below who presents for evaluation of loss of vision. Noticed change in vision in the AM but took a nap and then vision out but small tunnel.  + migraines - off and on but recently more often. Negative jaw claudication. No unexpected weight loss   ROS: As per HPI  PMH: Past Medical History:  Diagnosis Date   Anxiety    Arthritis    in back    Astigmatism    Back pain    Hyperlipidemia    past hx    Hypertension    Inguinal hernia of left side without obstruction or gangrene 07/15/2019   Thyroid disease     PSH: Past Surgical History:  Procedure Laterality Date   APPENDECTOMY     BACK SURGERY     PLEURAL SCARIFICATION      Meds: No current facility-administered medications on file prior to encounter.   Current Outpatient Medications on File Prior to Encounter  Medication Sig Dispense Refill   ibuprofen (ADVIL) 200 MG tablet Take 800 mg by mouth every 6 (six) hours as needed.      SH: Social History   Socioeconomic History   Marital status: Married    Spouse name: Kathie Rhodes   Number of children: Not on file   Years of education: Not on file   Highest education level: Not on file  Occupational History   Occupation: Scientist, product/process development    Employer: [employer]  Tobacco Use   Smoking status: Every Day    Current packs/day: 1.50    Average packs/day: 1.5 packs/day for 20.0 years (30.0 ttl pk-yrs)    Types: Cigarettes   Smokeless tobacco: Never  Vaping Use   Vaping status: Never Used  Substance and Sexual Activity   Alcohol use: Not Currently   Drug use: No   Sexual activity: Not Currently  Other Topics Concern   Not on file  Social History Narrative   He is married   Employed as a Chiropodist   Current smoker no alcohol or drug use   Social Drivers of Corporate investment banker Strain: Not on file  Food Insecurity: Not  on file  Transportation Needs: Not on file  Physical Activity: Not on file  Stress: Not on file  Social Connections: Unknown (09/30/2021)   Received from [employer]   Social Network    Social Network: Not on file    FH: Family History  Problem Relation Age of Onset   Diabetes Mother    Colon cancer Neg Hx    Pancreatic cancer Neg Hx    Esophageal cancer Neg Hx    Stomach cancer Neg Hx    Liver disease Neg Hx    Colon polyps Neg Hx    Rectal cancer Neg Hx     Exam:  Van: OD: 3 pt cc OS: CF  CVF: OD: full OS: constricted  EOM: OD: full d/v OS: full d/v  Pupils: OD: 3->2 mm, no APD OS: 3-> 2.5 mm + APD  IOP: by [place] OD: 19 OS: 20  External: OD: no periorbital edema, no proptosis, V1-V3 intact and symmetric, good orbicularis strength OS: no periorbital edema, no proptosis, V1-V3 intact and symmetric, good orbicularis strength   Pen Light Exam: L/L: OD: WNL OS: WNL  C/S: OD: white and quiet OS:  white and quiet  K: OD: clear, no abnormal staining OS: clear, no abnormal staining  A/C: OD: grossly deep and quiet appearing by pen light OS: grossly deep and quiet appearing by pen light  I: OD: round and regular OS: round and regular  L: OD: NSC OS: NSC  DFE: dilated @ 11:05 AM w/ Tropic and Phenyl OU  V: OD: clear OS: clear  N: OD: C/D 0.1, no disc edema OS: C/D 0.1, pale  M: OD: flat, no obvious macular pathology OS: cherry red spot - slight nasal sparing from ciloretinal artery   V: OD: normal appearing vessels OS: attenuated, cilioretinal artery nasal  P: OD: retina flat 360, no obvious mass/RT/RD OS: retina flat 360, no obvious mass/RT/RD  A/P:  1. Left Central Retinal Artery Occlusion: - Needs to complete stroke work-up - MRI, carotid, TTE - Needs blood work to rule out GCA - ESR, CRP, PLT counts - Patient w/ crowded optic nerve and at life long risk for NAION and given now functionally monocular would avoid any GLP  medications such as Ozempic due to potentially increased risk for NAION - Outpatient follow-up 2-4 weeks w/ Ophthalmology   Cristal Deer T. Sherryll Burger, MD [facility]  [phone]

## 2023-02-10 NOTE — ED Notes (Signed)
Back from CT with pt, returned to monitor. No changes. Alert, NAD, calm, interactive. Ophthalmologist at [facility]. Wife at [facility].

## 2023-02-11 ENCOUNTER — Ambulatory Visit (HOSPITAL_COMMUNITY): Payer: Self-pay

## 2023-02-11 ENCOUNTER — Observation Stay (HOSPITAL_BASED_OUTPATIENT_CLINIC_OR_DEPARTMENT_OTHER): Payer: Self-pay

## 2023-02-11 DIAGNOSIS — H3412 Central retinal artery occlusion, left eye: Principal | ICD-10-CM

## 2023-02-11 LAB — CBC
HCT: 44.7 % (ref 39.0–52.0)
Hemoglobin: 15 g/dL (ref 13.0–17.0)
MCH: 29.6 pg (ref 26.0–34.0)
MCHC: 33.6 g/dL (ref 30.0–36.0)
MCV: 88.2 fL (ref 80.0–100.0)
Platelets: 214 10*3/uL (ref 150–400)
RBC: 5.07 MIL/uL (ref 4.22–5.81)
RDW: 14 % (ref 11.5–15.5)
WBC: 11.3 10*3/uL — ABNORMAL HIGH (ref 4.0–10.5)
nRBC: 0 % (ref 0.0–0.2)

## 2023-02-11 LAB — LIPID PANEL
Cholesterol: 166 mg/dL (ref 0–200)
HDL: 32 mg/dL — ABNORMAL LOW (ref >40)
LDL Cholesterol: 92 mg/dL (ref 0–99)
Total CHOL/HDL Ratio: 5.2 {ratio}
Triglycerides: 211 mg/dL — ABNORMAL HIGH (ref <150)
VLDL: 42 mg/dL — ABNORMAL HIGH (ref 0–40)

## 2023-02-11 LAB — ECHOCARDIOGRAM COMPLETE BUBBLE STUDY
AR max vel: 1.87 cm2
AV Peak grad: 17.5 mm[Hg]
Ao pk vel: 2.09 m/s
Area-P 1/2: 3.28 cm2
Calc EF: 54.1 %
S' Lateral: 4.3 cm
Single Plane A2C EF: 49.1 %
Single Plane A4C EF: 61.8 %

## 2023-02-11 LAB — BASIC METABOLIC PANEL
Anion gap: 11 (ref 5–15)
BUN: 13 mg/dL (ref 6–20)
CO2: 20 mmol/L — ABNORMAL LOW (ref 22–32)
Calcium: 9.5 mg/dL (ref 8.9–10.3)
Chloride: 104 mmol/L (ref 98–111)
Creatinine, Ser: 1.02 mg/dL (ref 0.61–1.24)
GFR, Estimated: >60 mL/min (ref >60)
Glucose, Bld: 103 mg/dL — ABNORMAL HIGH (ref 70–99)
Potassium: 3.7 mmol/L (ref 3.5–5.1)
Sodium: 135 mmol/L (ref 135–145)

## 2023-02-11 LAB — TROPONIN I (HIGH SENSITIVITY)
Troponin I (High Sensitivity): 8 ng/L (ref <18)
Troponin I (High Sensitivity): 7 ng/L (ref <18)

## 2023-02-11 MED ORDER — ASPIRIN 81 MG PO TBEC: 81.0000 mg | DELAYED_RELEASE_TABLET | Freq: Every day | ORAL | 12 refills | Status: AC

## 2023-02-11 MED ORDER — ROSUVASTATIN CALCIUM 20 MG PO TABS: 20.0000 mg | ORAL_TABLET | Freq: Every day | ORAL | 0 refills | Status: DC

## 2023-02-11 MED ORDER — AMLODIPINE BESYLATE 10 MG PO TABS
10.0000 mg | ORAL_TABLET | Freq: Every day | ORAL | Status: DC
  Administered 2023-02-11: 10 mg via ORAL
  Filled 2023-02-11: qty 1

## 2023-02-11 MED ORDER — AMLODIPINE BESYLATE 10 MG PO TABS: 10.0000 mg | ORAL_TABLET | Freq: Every day | ORAL | 0 refills | Status: DC

## 2023-02-11 MED ORDER — ROSUVASTATIN CALCIUM 20 MG PO TABS: 20.0000 mg | ORAL_TABLET | Freq: Every day | ORAL | Status: DC

## 2023-02-11 MED ORDER — ROSUVASTATIN CALCIUM 20 MG PO TABS: 20.0000 mg | ORAL_TABLET | Freq: Every day | ORAL | 0 refills | Status: AC

## 2023-02-11 MED ORDER — NITROGLYCERIN 0.4 MG SL SUBL: 0.4000 mg | SUBLINGUAL_TABLET | SUBLINGUAL | Status: DC | PRN

## 2023-02-11 MED ORDER — ASPIRIN 81 MG PO TBEC: 81.0000 mg | DELAYED_RELEASE_TABLET | Freq: Every day | ORAL | 12 refills | Status: DC

## 2023-02-11 MED ORDER — CLOPIDOGREL BISULFATE 75 MG PO TABS: 75.0000 mg | ORAL_TABLET | Freq: Every day | ORAL | 0 refills | Status: DC

## 2023-02-11 MED ORDER — CLOPIDOGREL BISULFATE 75 MG PO TABS: 75.0000 mg | ORAL_TABLET | Freq: Every day | ORAL | 0 refills | Status: AC

## 2023-02-11 MED ORDER — AMLODIPINE BESYLATE 10 MG PO TABS: 10.0000 mg | ORAL_TABLET | Freq: Every day | ORAL | 0 refills | Status: AC

## 2023-02-11 NOTE — Progress Notes (Addendum)
STROKE TEAM PROGRESS NOTE   BRIEF HPI Mr. [name] is a 62 y.o. male with history of HLD, HTN, current tobacco smoking, obesity with BMI 30.39 kg/m, and anxiety presenting with painless vision loss in his left eye.  Patient described an initial dark spot in his vision OS that spread throughout his visual field over the course of 5-10 minutes with sparing of a small "keyhole" of preserved vision near the center visual field OS.  Ophthalmology dilated retinal exam revealed left CRAO and patient was sent for further evaluation.  CTA reveals left ICA stenosis of 80% and patient is planned for CEA per vascular surgery during admission.   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 Given that he is now functionally monocular they would avoid any GLP-1 medications such as Ozempic due to potentially increased risk for NAION. Also recommending outpatient follow-up in 2-4 weeks w/ Ophthalmology.   SIGNIFICANT HOSPITAL EVENTS 12/28:  - Presentation for left CRAO - CTA with left ICA stenosis of 80%, vascular surgery consulted 12/29: - Per vascular surgery plan for outpatient procedure Thursday 1/2  INTERIM HISTORY/SUBJECTIVE Plans for left ICA TCAR per vascular surgery due to symptomatic 80% stenosis and radiographic string sign.  Procedure scheduled outpatient for Thursday 02/15/23.  Patient reports 1-2 weeks of unprovoked palpitations prior to admission. Visual exam stable with left monocular vision loss without further focal findings.  OBJECTIVE CBC    Component Value Date/Time   WBC 11.3 (H) 02/11/2023 0523   RBC 5.07 02/11/2023 0523   HGB 15.0 02/11/2023 0523   HCT 44.7 02/11/2023 0523   PLT 214 02/11/2023 0523   MCV 88.2 02/11/2023 0523   MCH 29.6 02/11/2023 0523   MCHC 33.6 02/11/2023 0523   RDW 14.0 02/11/2023 0523   LYMPHSABS 1.9 02/10/2023 1016   MONOABS 0.7 02/10/2023 1016   EOSABS 0.2 02/10/2023 1016   BASOSABS 0.1  02/10/2023 1016   BMET    Component Value Date/Time   NA 135 02/11/2023 0523   K 3.7 02/11/2023 0523   CL 104 02/11/2023 0523   CO2 20 (L) 02/11/2023 0523   GLUCOSE 103 (H) 02/11/2023 0523   BUN 13 02/11/2023 0523   CREATININE 1.02 02/11/2023 0523   CALCIUM 9.5 02/11/2023 0523   GFRNONAA >60 02/11/2023 0523   No results found for: "HGBA1C"  Lab Results  Component Value Date   CHOL 166 02/11/2023   HDL 32 (L) 02/11/2023   LDLCALC 92 02/11/2023   TRIG 211 (H) 02/11/2023   CHOLHDL 5.2 02/11/2023   IMAGING past 24 hours MR BRAIN WO CONTRAST Result Date: 02/10/2023 CLINICAL DATA:  Neuro deficit, acute, stroke suspected. Abnormal vision in the left eye. EXAM: MRI HEAD WITHOUT CONTRAST TECHNIQUE: Multiplanar, multiecho pulse sequences of the brain and surrounding structures were obtained without intravenous contrast. COMPARISON:  MR scratched at CT angio head and neck 02/10/2023 FINDINGS: Brain: No acute infarct, hemorrhage, or mass lesion is present. Scattered periventricular and subcortical T2 hyperintensities are mildly advanced for age. The ventricles are of normal size. No significant extraaxial fluid collection is present. Deep brain nuclei are within normal limits. The brainstem and cerebellum are within normal limits. The internal auditory canals are within normal limits. Midline structures are within normal limits. Vascular: Flow is present in the major intracranial arteries. Skull and upper cervical spine: The craniocervical junction is normal. Upper cervical spine is within normal limits. Marrow signal is unremarkable. Sinuses/Orbits: Fluid  level is present within an atelectatic right maxillary sinus. Paranasal sinuses are otherwise clear. Bilateral mastoid effusions are present. No obstructing nasopharyngeal lesion is present. The globes and orbits are within normal limits. IMPRESSION: 1. No acute intracranial abnormality. 2. Scattered periventricular and subcortical T2  hyperintensities are mildly advanced for age. The finding is nonspecific but can be seen in the setting of chronic microvascular ischemia, a demyelinating process such as multiple sclerosis, vasculitis, complicated migraine headaches, or as the sequelae of a prior infectious or inflammatory process. 3. Bilateral mastoid effusions. No obstructing nasopharyngeal lesion is present. 4. Fluid level within an atelectatic right maxillary sinus. This likely reflects acute on chronic sinusitis. Electronically Signed   By: Marin Roberts M.D.   On: 02/10/2023 15:50   CT ANGIO HEAD NECK W WO CM Result Date: 02/10/2023 CLINICAL DATA:  62 year old male with abnormal left side vision. Neurologic deficit. EXAM: CT ANGIOGRAPHY HEAD AND NECK WITH AND WITHOUT CONTRAST TECHNIQUE: Multidetector CT imaging of the head and neck was performed using the standard protocol during bolus administration of intravenous contrast. Multiplanar CT image reconstructions and MIPs were obtained to evaluate the vascular anatomy. Carotid stenosis measurements (when applicable) are obtained utilizing NASCET criteria, using the distal internal carotid diameter as the denominator. RADIATION DOSE REDUCTION: This exam was performed according to the departmental dose-optimization program which includes automated exposure control, adjustment of the mA and/or kV according to patient size and/or use of iterative reconstruction technique. CONTRAST:  75mL OMNIPAQUE IOHEXOL 350 MG/ML SOLN COMPARISON:  None Available. FINDINGS: CT HEAD Brain: Cerebral volume is within normal limits for age. Asymmetric hypodensity at the pole of the right occipital lobe on series 5 image 11 is questionably artifact (also sagittal image 30 of series 8. No midline shift, ventriculomegaly, mass effect, evidence of mass lesion, intracranial hemorrhage. No other cortically based acute infarction. Calvarium and skull base: Intact. No acute osseous abnormality identified. Paranasal  sinuses: Mucoperiosteal thickening and bubbly opacity in the right maxillary sinus. Minor paranasal sinus mucosal thickening otherwise. Tympanic cavities and mastoids are clear. Orbits: Visualized scalp soft tissues are within normal limits. Orbits soft tissues appears symmetric and negative. CTA NECK Skeleton: Absent dentition. Mild for age spine degeneration. No acute osseous abnormality identified. Upper chest: Negative; symmetric dependent pulmonary atelectasis. Other neck: Retained secretions in the nasopharynx, other bilateral neck soft tissue spaces are within normal limits. No cervical lymphadenopathy. Aortic arch: Calcified aortic atherosclerosis.  3 vessel arch. Right carotid system: Obscured right CCA origin from dense right subclavian venous contrast streak. Proximal to that the brachiocephalic artery demonstrates minimal atherosclerosis. Patent right CCA. Soft and calcified plaque at the right carotid bifurcation, right ICA origin and bulb. But no significant stenosis. Left carotid system: Patent left CCA with soft plaque mostly above the level of the larynx. Bulky soft and calcified plaque at the left ICA origin and bulb with high-grade proximal left ICA stenosis numerically estimated at 80 % with respect to the distal vessel. See series 12, image 150, series 11, image 122). The left ICA remains patent with additional calcified plaque at the bulb. Vertebral arteries: Right subclavian origin also obscured by right subclavian venous contrast streak. Normal right vertebral artery origin. Patent right vertebral artery to the skull base with no significant plaque or stenosis. Mild proximal left subclavian plaque without stenosis. Soft plaque at the left vertebral artery origin results in moderate stenosis on series 12, image 151. Non dominant left vertebral artery otherwise is patent with no additional plaque or stenosis to  the skull base. CTA HEAD Posterior circulation: Dominant right vertebral V4 segment  is patent to the basilar without plaque or stenosis. There is left V4 mild plaque and stenosis near the left PICA origin which remains patent. Patent left vertebrobasilar junction. Patent basilar artery. Patent SCA and PCA origins. Posterior communicating arteries are diminutive or absent. Bilateral PCA branches are patent. No right PCA branch occlusion is identified. There is mild left P2 segment irregularity. Anterior circulation: Both ICA siphons are patent. No left siphon plaque or stenosis. The left ophthalmic artery appears to remain patent at the orbital apex on coronal series 12, image 181 although the left ophthalmic artery origin is difficult to identify. On coronal images the right ophthalmic artery enhancement appears more robust. However, comparing axial images series 9, image 114 the ophthalmic artery enhancement appears symmetric. Overall, No ophthalmic artery abnormality by CTA. Right ICA siphon mild atherosclerosis without stenosis. Patent carotid termini. Normal MCA and ACA origins. Anterior communicating arteries within normal limits. Bilateral ACA branches are within normal limits. Left MCA M1 segment and bifurcation are patent without stenosis. Right MCA M1 segment and bifurcation are patent without stenosis. Bilateral MCA branches are within normal limits. Venous sinuses: Patent. Anatomic variants: Dominant right vertebral artery. Review of the MIP images confirms the above findings IMPRESSION: 1. Negative for large vessel occlusion or intracranial aneurysm. But positive for bulky Left Carotid bifurcation plaque with High-grade proximal Left ICA stenosis estimated at 80%, approaching a radiographic-string-sign. 2. Moderate stenosis of the left vertebral artery origin related to soft plaque. Mild distal left vertebral artery stenosis. 3. No definite acute intracranial abnormality. By head CT, suspect artifactual hypodensity at the right occipital pole. 4.  Aortic Atherosclerosis (ICD10-I70.0).  Electronically Signed   By: Odessa Fleming M.D.   On: 02/10/2023 11:47   Vitals:   02/10/23 1944 02/10/23 2349 02/11/23 0354 02/11/23 0749  BP: (!) 160/82 (!) 150/73 (!) 169/84 (!) 167/83  Pulse: 70 72 75 79  Resp: 17 17 18 17   Temp: 98.1 F (36.7 C) 98.4 F (36.9 C) 98.2 F (36.8 C)   TempSrc: Oral Oral Oral   SpO2: 98% 95% 97% 98%  Weight:      Height:       PHYSICAL EXAM General:  Alert, well-nourished, well-developed patient in no acute distress Psych:  Mood and affect appropriate for situation, patient calm and cooperative with examination CV: Regular rate and rhythm on monitor Respiratory:  Regular, unlabored respirations on room air GI: Abdomen soft and nontender  NEURO:  Mental Status: AA&Ox3, patient is able to give clear and coherent history Speech/Language: speech is without dysarthria or aphasia.  Naming, repetition, fluency, and comprehension intact.  Cranial Nerves:  II: PERRL. OS: monocular vision loss save a small central spot of preserved vision through which she can perceive light and some large objects/movement.  OD visual fields intact throughout. III, IV, VI: EOMI without gaze preference or nystagmus V: Sensation is intact to light touch and symmetrical to face.  VII: Face is symmetrical resting and and with movement VIII: Hearing intact to voice. IX, X: Palate elevates symmetrically. Phonation is normal.  XI: Shoulder shrug 5/5. XII: Tongue protrudes midline Motor: 5/5 strength to confrontation throughout Tone: is normal and bulk is normal Sensation: Intact to light touch bilaterally. Extinction absent to light touch to DSS.   Coordination: FTN intact bilaterally, HKS: no ataxia in BLE.No drift.  Gait: Deferred  ASSESSMENT/PLAN Left central retinal artery occlusion Etiology: Likely large vessel disease with high-grade  proximal left ICA stenosis approaching string sign CTA head & neck negative for LVO or intracranial aneurysm.  Positive for bulky left  carotid bifurcation plaque with high-grade proximal left ICA stenosis estimated to 80%, approaching a radiographic string sign.  Moderate stenosis of the left vertebral artery origin related to soft plaque.  Mild distal left vertebral artery stenosis.  No definite acute intracranial abnormality.  By head CT, suspect artifactual hypodensity in the right occipital pole.  Aortic atherosclerosis. MRI brain: No acute intracranial abnormality.  Scattered periventricular and subcortical T2 hyperintensities are mildly advanced for age. 2D Echo LVEF 50-55% LDL 92 HgbA1c pending VTE prophylaxis -SQ Lovenox aspirin 81 mg daily prior to admission, now on aspirin 81 mg daily and clopidogrel 75 mg daily prior to outpatient planned procedure.  Further regimen per vascular team. Therapy recommendations:  No follow up needed  Disposition: Pending  Left ICA stenosis CTA head & neck Positive for bulky left carotid bifurcation plaque with high-grade proximal left ICA stenosis estimated to 80%, approaching a radiographic string sign.  VVS on board Outpatient left TCAR planned per vascular surgery Thursday, 02/15/2023 On DAPT  Palpitation  Pt stated that early Dec he had shingle and pneumonia vaccination, after that he had two episodes of heart fluttering lasting 15-60 min  Recommend 30 day cardiac event monitoring as outpt Cardiology aware.   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  Given that he is now functionally monocular they would avoid any GLP-1 medications such as Ozempic due to potentially increased risk for NAION. F/u ophthalmology in 2 weeks.   Hospital day # 0  Lanae Boast, AGACNP-BC [facility] Pager: [phone]  ATTENDING NOTE: I reviewed above note and agree with the assessment and plan. Pt was seen and examined.   Wife and other family members at the bedside. Pt neuro intact except left eye vision loss. Left eye can only see "key hole" at the left upper quadrant visual field. MRI no stroke but CTA showed left ICA high grade stenosis, VVS on board plan TCAR next Thursday. Now on DAPT and increase crestor to 20. Complaining of heart palpitation x 2 after shingle and pneumonia vaccine. Recommend 30 day monitoring. PT and OT no rec. Continue follow up with ophthalmology   For detailed assessment and plan, please refer to above/below as I have made changes wherever appropriate.   Neurology will sign off. Please call with questions. Pt will follow up with stroke clinic NP at [facility] in about 4 weeks. Thanks for the consult.   Marvel Plan, MD PhD Stroke Neurology 02/11/2023 3:06 PM    To contact Stroke Continuity provider, please refer to [email or web address]. After hours, contact General Neurology

## 2023-02-11 NOTE — Consult Note (Signed)
[facility] [place]  ASSESSMENT / PLAN: [name] is a 62 y.o. male with symptomatic left carotid artery stenosis  Recommend:  Abstinence from all tobacco products. Blood glucose control with goal A1c < 7%. Blood pressure control with goal blood pressure < 140/90 mmHg. Lipid reduction therapy with goal LDL-C <100 mg/dL  Aspirin 81mg  PO QD.  Clopidogrel 75mg  PO QD. Atorvastatin 40-80mg  PO QD (or other "high intensity" statin therapy).  Plan left TCAR Thursday, 02/14/2022.  Patient instructed to continue dual antiplatelet therapy.  He should take Glena platelet therapy the day of surgery as well.  He should continue statin therapy.  He may be discharged from my standpoint and follow-up as an outpatient for surgery.  I have completed Share Decision Making with Doristine Devoid prior to surgery.  Conversations included: -Discussion of all treatment options including carotid endarterectomy (CEA), CAS (which includes transcarotid artery revascularization (TCAR)), and optimal medical therapy (OMT)). -Explanation of risks and benefits for each option specific to [facility] clinical situation. -Integration of clinical guidelines as it relates to the patient's history and co-morbidities -Discussion and incorporation of JEYLAN ARRIZON and their personal preferences and priorities in choosing a treatment plan.  CHIEF COMPLAINT: Left eye blindness  HISTORY OF PRESENT ILLNESS: [name] is a 62 y.o. male admitted to the hospital for left eye blindness beginning yesterday.  The patient awoke with limited visual acuity in the left eye.  He had no antecedent symptoms.  He does report migraine type headaches in the weeks prior to his evaluation.  He reports no focal neurologic symptoms or cranial nerve issues prior to the day his symptoms began.  We reviewed his test results in detail.  I reviewed the 2 main types of intervention for the patient in detail.  Past Medical  History:  Diagnosis Date   Anxiety    Arthritis    in back    Astigmatism    Back pain    Hyperlipidemia    past hx    Hypertension    Inguinal hernia of left side without obstruction or gangrene 07/15/2019   Thyroid disease     Past Surgical History:  Procedure Laterality Date   APPENDECTOMY     BACK SURGERY     PLEURAL SCARIFICATION      Family History  Problem Relation Age of Onset   Diabetes Mother    Colon cancer Neg Hx    Pancreatic cancer Neg Hx    Esophageal cancer Neg Hx    Stomach cancer Neg Hx    Liver disease Neg Hx    Colon polyps Neg Hx    Rectal cancer Neg Hx     Social History   Socioeconomic History   Marital status: Married    Spouse name: Kathie Rhodes   Number of children: Not on file   Years of education: Not on file   Highest education level: Not on file  Occupational History   Occupation: Scientist, product/process development    Employer: [employer] examiner  Tobacco Use   Smoking status: Every Day    Current packs/day: 1.50    Average packs/day: 1.5 packs/day for 20.0 years (30.0 ttl pk-yrs)    Types: Cigarettes   Smokeless tobacco: Never  Vaping Use   Vaping status: Never Used  Substance and Sexual Activity   Alcohol use: Not Currently   Drug use: No   Sexual activity: Not Currently  Other Topics Concern   Not on file  Social History Narrative   He is married   Employed as a Chiropodist   Current smoker no alcohol or drug use   Social Drivers of Corporate investment banker Strain: Not on file  Food Insecurity: No Food Insecurity (02/11/2023)   Hunger Vital Sign    Worried About Running Out of Food in the Last Year: Never true    Ran Out of Food in the Last Year: Never true  Transportation Needs: No Transportation Needs (02/11/2023)   PRAPARE - Administrator, Civil Service (Medical): No    Lack of Transportation (Non-Medical): No  Physical Activity: Not on file  Stress: Not on file  Social Connections: Unknown (09/30/2021)    Received from [facility]   Social Network    Social Network: Not on file  Intimate Partner Violence: Not At Risk (02/11/2023)   Humiliation, Afraid, Rape, and Kick questionnaire    Fear of Current or Ex-Partner: No    Emotionally Abused: No    Physically Abused: No    Sexually Abused: No    No Known Allergies  Current Facility-Administered Medications  Medication Dose Route Frequency Provider Last Rate Last Admin   acetaminophen (TYLENOL) tablet 650 mg  650 mg Oral Q6H PRN Dahal, Melina Schools, MD       Or   acetaminophen (TYLENOL) suppository 650 mg  650 mg Rectal Q6H PRN Dahal, Binaya, MD       albuterol (PROVENTIL) (2.5 MG/3ML) 0.083% nebulizer solution 2.5 mg  2.5 mg Nebulization Q6H PRN Dahal, Binaya, MD       amLODipine (NORVASC) tablet 10 mg  10 mg Oral Daily Dahal, Binaya, MD   10 mg at 02/11/23 0804   aspirin EC tablet 81 mg  81 mg Oral Daily Shafer, Ludger Nutting, NP   81 mg at 02/11/23 0800   bisacodyl (DULCOLAX) EC tablet 5 mg  5 mg Oral Daily PRN Dahal, Melina Schools, MD       clopidogrel (PLAVIX) tablet 75 mg  75 mg Oral Daily Shafer, Ludger Nutting, NP   75 mg at 02/11/23 0800   enoxaparin (LOVENOX) injection 40 mg  40 mg Subcutaneous Q24H Dahal, Binaya, MD   40 mg at 02/10/23 1539   hydrALAZINE (APRESOLINE) injection 10 mg  10 mg Intravenous Q6H PRN Dahal, Melina Schools, MD   10 mg at 02/11/23 0556   levothyroxine (SYNTHROID) tablet 112 mcg  112 mcg Oral Q0600 Dahal, Melina Schools, MD   112 mcg at 02/11/23 0556   nicotine (NICODERM CQ - dosed in mg/24 hours) patch 21 mg  21 mg Transdermal Daily Dahal, Binaya, MD   21 mg at 02/11/23 0801   nitroGLYCERIN (NITROSTAT) SL tablet 0.4 mg  0.4 mg Sublingual Q5 min PRN Crosley, Debby, MD       polyethylene glycol (MIRALAX / GLYCOLAX) packet 17 g  17 g Oral Daily PRN Dahal, Melina Schools, MD       [START ON 02/12/2023] rosuvastatin (CRESTOR) tablet 20 mg  20 mg Oral Daily Marvel Plan, MD        PHYSICAL EXAM Vitals:   02/10/23 1944 02/10/23 2349 02/11/23 0354 02/11/23 0749   BP: (!) 160/82 (!) 150/73 (!) 169/84 (!) 167/83  Pulse: 70 72 75 79  Resp: 17 17 18 17   Temp: 98.1 F (36.7 C) 98.4 F (36.9 C) 98.2 F (36.8 C) 97.6 F (36.4 C)  TempSrc: Oral Oral Oral Oral  SpO2: 98% 95% 97% 98%  Weight:      Height:  No distress Regular rate and rhythm Unlabored breathing Normal strength in extremities Reports L visual changes. No other CN abnormalities   PERTINENT LABORATORY AND RADIOLOGIC DATA  Most recent CBC    Latest Ref Rng & Units 02/11/2023    5:23 AM 02/10/2023   10:16 AM  CBC  WBC 4.0 - 10.5 K/uL 11.3  12.1   Hemoglobin 13.0 - 17.0 g/dL 91.4  78.2   Hematocrit 39.0 - 52.0 % 44.7  42.4   Platelets 150 - 400 K/uL 214  207      Most recent CMP    Latest Ref Rng & Units 02/11/2023    5:23 AM 02/10/2023   10:16 AM  CMP  Glucose 70 - 99 mg/dL 956  96   BUN 6 - 20 mg/dL 13  15   Creatinine 2.13 - 1.24 mg/dL 0.86  5.78   Sodium 469 - 145 mmol/L 135  137   Potassium 3.5 - 5.1 mmol/L 3.7  4.0   Chloride 98 - 111 mmol/L 104  106   CO2 22 - 32 mmol/L 20  23   Calcium 8.9 - 10.3 mg/dL 9.5  9.0   Total Protein 6.5 - 8.1 g/dL  7.1   Total Bilirubin <1.2 mg/dL  0.7   Alkaline Phos 38 - 126 U/L  88   AST 15 - 41 U/L  18   ALT 0 - 44 U/L  16     Renal function Estimated Creatinine Clearance: 111.8 mL/min (by C-G formula based on SCr of 1.02 mg/dL).  No results found for: "HGBA1C"  LDL Cholesterol  Date Value Ref Range Status  02/11/2023 92 0 - 99 mg/dL Final    Comment:           Total Cholesterol/HDL:CHD Risk Coronary Heart Disease Risk Table                     Men   Women  1/2 Average Risk   3.4   3.3  Average Risk       5.0   4.4  2 X Average Risk   9.6   7.1  3 X Average Risk  23.4   11.0        Use the calculated Patient Ratio above and the CHD Risk Table to determine the patient's CHD Risk.        ATP III CLASSIFICATION (LDL):  <100     mg/dL   Optimal  629-528  mg/dL   Near or Above                     Optimal  130-159  mg/dL   Borderline  413-244  mg/dL   High  >010     mg/dL   Very High Performed at [facility], [address], [place], [place] [address]     MRI brain personally reviewed. CT angiogram personally reviewed.  Anatomy appears suitable for left TCAR.  Rande Brunt. Lenell Antu, MD FACS [facility] Phone Number: [phone] 02/11/2023 11:00 AM   Total time spent on preparing this encounter including chart review, data review, collecting history, examining the patient, coordinating care for this new patient, 80 minutes.  Portions of this report may have been transcribed using voice recognition software.  Every effort has been made to ensure accuracy; however, inadvertent computerized transcription errors may still be present.

## 2023-02-11 NOTE — Progress Notes (Signed)
Patient complains of palpitations and chest pain 2/10. I sent Dr. Joneen Roach a secure message to inform her.

## 2023-02-11 NOTE — Evaluation (Signed)
Occupational Therapy Evaluation Patient Details Name: [name] MRN: [id number] DOB: [birth year] Today's Date: 02/11/2023   History of Present Illness SIGFREDO Mcconnell is a 62 y.o. male who presents with painless vision loss in his left eye.  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  Past medical history of Anxiety, Arthritis, Astigmatism, Back pain, Hyperlipidemia, Hypertension, Inguinal hernia of left side without obstruction or gangrene (07/15/2019), and Thyroid disease.  .   Clinical Impression   Pt currently independent to modified independent for ADLs, transfers, and mobility.  Does exhibit decreased vision in the left eye with intact vision only in a small area of left lateral field.  He reports seeing better with his left eye closed and more difficulty when both are open as the small area of vision he has in the left eye disrupts him with activities.  Recommended use of patch during functional tasks to help decrease frustration but to not wear it all the time so light can still access the eye and his brain can continue working on processing the visual change.  No further acute OT needs or DME needs at this time.            Functional Status Assessment  Patient has not had a recent decline in their functional status  Equipment Recommendations  None recommended by OT       Precautions / Restrictions Precautions Precaution Comments: left eye blindness Restrictions Weight Bearing Restrictions Per Provider Order: No      Mobility Bed Mobility Overal bed mobility: Independent                  Transfers Overall transfer level: Independent Equipment used: None                      Balance Overall balance assessment: No apparent balance deficits (not formally assessed)                                         ADL either performed or assessed with  clinical judgement   ADL Overall ADL's : Modified independent                                       General ADL Comments: Discussed vision loss and impact on depth perception and needing to take his time with reaching and stepping over objects and up steps.  He reports having more difficulty with completion of tasks keeping the left eye open as he sees some squiggly gray lines but with his eye closed he is able to see better and ambulate without difficulty.  Recommend integrating eye patch over the left eye during functional tasks to help increase function, but to not wear all the time, so his brain can adjust to visual changes.  No further acute or post acute OT needs at this time.     Vision Baseline Vision/History: 1 Wears glasses Ability to See in Adequate Light: 1 Impaired Patient Visual Report: Other (comment) (left eye blindness) Vision Assessment?: Yes Eye Alignment: Within Functional Limits Ocular Range of Motion: Within Functional Limits Alignment/Gaze Preference: Within Defined Limits Tracking/Visual Pursuits: Able to track stimulus in all quads without difficulty Convergence: Within functional limits Visual Fields:  Other (comment) (decreased left visual field secondary to visual disturbance in the left eye.  Unable to see lateral peripheral field with gross testing.) Additional Comments: Pt with left eye blindness but can see some in the lateral field on the left but not clear.  Central field as well as inferior and superior central he cannot see.     Perception Perception: Within Functional Limits       Praxis Praxis: WFL       Pertinent Vitals/Pain Pain Assessment Pain Assessment: No/denies pain     Extremity/Trunk Assessment Upper Extremity Assessment Upper Extremity Assessment: Overall WFL for tasks assessed   Lower Extremity Assessment Lower Extremity Assessment: Overall WFL for tasks assessed   Cervical / Trunk Assessment Cervical / Trunk  Assessment: Normal   Communication Communication Communication: No apparent difficulties   Cognition Arousal: Alert Behavior During Therapy: WFL for tasks assessed/performed Overall Cognitive Status: Within Functional Limits for tasks assessed                                                  Home Living Family/patient expects to be discharged to:: Private residence Living Arrangements: Spouse/significant other Available Help at Discharge: Available PRN/intermittently Type of Home: Mobile home Home Access: Stairs to enter Secretary/administrator of Steps: 1   Home Layout: One level     Bathroom Shower/Tub: Chief Strategy Officer: Standard Bathroom Accessibility: Yes How Accessible: Accessible via walker Home Equipment: None          Prior Functioning/Environment Prior Level of Function : Independent/Modified Independent                                           AM-PAC OT "6 Clicks" Daily Activity     Outcome Measure Help from another person eating meals?: None Help from another person taking care of personal grooming?: None Help from another person toileting, which includes using toliet, bedpan, or urinal?: None Help from another person bathing (including washing, rinsing, drying)?: None Help from another person to put on and taking off regular upper body clothing?: None Help from another person to put on and taking off regular lower body clothing?: None 6 Click Score: 24   End of Session Nurse Communication: Mobility status  Activity Tolerance: Patient tolerated treatment well Patient left: in bed;with family/visitor present                   Time: 4098-1191 OT Time Calculation (min): 23 min Charges:  OT General Charges $OT Visit: 1 Visit OT Evaluation $OT Eval Low Complexity: 1 Low OT Treatments $Self Care/Home Management : 8-22 mins  Perrin Maltese, OTR/L Acute Rehabilitation Services  Office  [phone] 02/11/2023

## 2023-02-11 NOTE — Progress Notes (Signed)
SLP Cancellation Note  Patient Details Name: [name] MRN: [id number] DOB: 12-27-1960   Cancelled treatment:       Reason Eval/Treat Not Completed: SLP screened. Per pt and his family, he has not experienced any changes to his cognitive-linguistic function. No needs identified, will sign off.    Gwynneth Aliment, M.A., CF-SLP Speech Language Pathology, Acute Rehabilitation Services  Secure Chat preferred [phone]  02/11/2023, 9:33 AM

## 2023-02-11 NOTE — Plan of Care (Signed)
Problem: Education: Goal: Knowledge of disease or condition will improve 02/11/2023 0014 by Tennis Ship, RN Outcome: Progressing 02/11/2023 0014 by Tennis Ship, RN Outcome: Progressing Goal: Knowledge of secondary prevention will improve (MUST DOCUMENT ALL) 02/11/2023 0014 by Tennis Ship, RN Outcome: Progressing 02/11/2023 0014 by Tennis Ship, RN Outcome: Progressing Goal: Knowledge of patient specific risk factors will improve Loraine Leriche N/A or DELETE if not current risk factor) 02/11/2023 0014 by Tennis Ship, RN Outcome: Progressing 02/11/2023 0014 by Tennis Ship, RN Outcome: Progressing   Problem: Ischemic Stroke/TIA Tissue Perfusion: Goal: Complications of ischemic stroke/TIA will be minimized 02/11/2023 0014 by Tennis Ship, RN Outcome: Progressing 02/11/2023 0014 by Tennis Ship, RN Outcome: Progressing   Problem: Coping: Goal: Will verbalize positive feelings about self 02/11/2023 0014 by Tennis Ship, RN Outcome: Progressing 02/11/2023 0014 by Tennis Ship, RN Outcome: Progressing Goal: Will identify appropriate support needs 02/11/2023 0014 by Tennis Ship, RN Outcome: Progressing 02/11/2023 0014 by Tennis Ship, RN Outcome: Progressing   Problem: Health Behavior/Discharge Planning: Goal: Ability to manage health-related needs will improve 02/11/2023 0014 by Tennis Ship, RN Outcome: Progressing 02/11/2023 0014 by Tennis Ship, RN Outcome: Progressing Goal: Goals will be collaboratively established with patient/family 02/11/2023 0014 by Tennis Ship, RN Outcome: Progressing 02/11/2023 0014 by Tennis Ship, RN Outcome: Progressing   Problem: Self-Care: Goal: Ability to participate in self-care as condition permits will improve 02/11/2023 0014 by Tennis Ship, RN Outcome: Progressing 02/11/2023 0014 by Tennis Ship, RN Outcome: Progressing Goal:  Verbalization of feelings and concerns over difficulty with self-care will improve 02/11/2023 0014 by Tennis Ship, RN Outcome: Progressing 02/11/2023 0014 by Tennis Ship, RN Outcome: Progressing Goal: Ability to communicate needs accurately will improve 02/11/2023 0014 by Tennis Ship, RN Outcome: Progressing 02/11/2023 0014 by Tennis Ship, RN Outcome: Progressing   Problem: Nutrition: Goal: Risk of aspiration will decrease 02/11/2023 0014 by Tennis Ship, RN Outcome: Progressing 02/11/2023 0014 by Tennis Ship, RN Outcome: Progressing Goal: Dietary intake will improve 02/11/2023 0014 by Tennis Ship, RN Outcome: Progressing 02/11/2023 0014 by Tennis Ship, RN Outcome: Progressing   Problem: Education: Goal: Knowledge of General Education information will improve Description: Including pain rating scale, medication(s)/side effects and non-pharmacologic comfort measures 02/11/2023 0014 by Tennis Ship, RN Outcome: Progressing 02/11/2023 0014 by Tennis Ship, RN Outcome: Progressing   Problem: Health Behavior/Discharge Planning: Goal: Ability to manage health-related needs will improve 02/11/2023 0014 by Tennis Ship, RN Outcome: Progressing 02/11/2023 0014 by Tennis Ship, RN Outcome: Progressing   Problem: Clinical Measurements: Goal: Ability to maintain clinical measurements within normal limits will improve 02/11/2023 0014 by Tennis Ship, RN Outcome: Progressing 02/11/2023 0014 by Tennis Ship, RN Outcome: Progressing Goal: Will remain free from infection 02/11/2023 0014 by Tennis Ship, RN Outcome: Progressing 02/11/2023 0014 by Tennis Ship, RN Outcome: Progressing Goal: Diagnostic test results will improve 02/11/2023 0014 by Tennis Ship, RN Outcome: Progressing 02/11/2023 0014 by Tennis Ship, RN Outcome: Progressing Goal: Respiratory  complications will improve 02/11/2023 0014 by Tennis Ship, RN Outcome: Progressing 02/11/2023 0014 by Tennis Ship, RN Outcome: Progressing Goal: Cardiovascular complication will be avoided 02/11/2023 0014 by Tennis Ship, RN Outcome: Progressing 02/11/2023 0014 by Tennis Ship, RN Outcome: Progressing   Problem: Activity: Goal: Risk for activity intolerance will decrease 02/11/2023 0014 by Tennis Ship, RN Outcome: Progressing  02/11/2023 0014 by Tennis Ship, RN Outcome: Progressing   Problem: Nutrition: Goal: Adequate nutrition will be maintained 02/11/2023 0014 by Tennis Ship, RN Outcome: Progressing 02/11/2023 0014 by Tennis Ship, RN Outcome: Progressing   Problem: Coping: Goal: Level of anxiety will decrease 02/11/2023 0014 by Tennis Ship, RN Outcome: Progressing 02/11/2023 0014 by Tennis Ship, RN Outcome: Progressing   Problem: Elimination: Goal: Will not experience complications related to bowel motility 02/11/2023 0014 by Tennis Ship, RN Outcome: Progressing 02/11/2023 0014 by Tennis Ship, RN Outcome: Progressing Goal: Will not experience complications related to urinary retention 02/11/2023 0014 by Tennis Ship, RN Outcome: Progressing 02/11/2023 0014 by Tennis Ship, RN Outcome: Progressing   Problem: Pain Management: Goal: General experience of comfort will improve 02/11/2023 0014 by Tennis Ship, RN Outcome: Progressing 02/11/2023 0014 by Tennis Ship, RN Outcome: Progressing   Problem: Safety: Goal: Ability to remain free from injury will improve 02/11/2023 0014 by Tennis Ship, RN Outcome: Progressing 02/11/2023 0014 by Tennis Ship, RN Outcome: Progressing   Problem: Skin Integrity: Goal: Risk for impaired skin integrity will decrease 02/11/2023 0014 by Tennis Ship, RN Outcome: Progressing 02/11/2023 0014 by Tennis Ship, RN Outcome: Progressing

## 2023-02-11 NOTE — Discharge Summary (Addendum)
Physician Discharge Summary  [name] NWG:[id number] DOB: Dec 05, 1960 DOA: 02/10/2023  PCP: Kaleen Mask, MD  Admit date: 02/10/2023 Discharge date: 02/11/2023  Admitted From: Home Discharge disposition: Home  Recommendations at discharge:  You have been started on aspirin, Plavix and statin. Follow-up with vascular surgery as an outpatient for left TCAR on 02/14/2022 Blood pressure is elevated.  You have been started on amlodipine 10 mg daily.  Continue to monitor blood pressure at home.  Follow-up with PCP as an outpatient for further adjustment.  Brief narrative: [name] is a 62 y.o. male with PMH significant for HTN, HLD arthritis, anxiety, hypothyroidism. Patient presented to the ED this morning with painless loss of vision in the left eye.  Patient went to bed last night around 830 to 9 PM in usual state of his health. He woke up in the morning with what he described an initial dark spot in left eye vision, that spread throughout his visual field over the course of 5-10 minutes with sparing of a small "keyhole" of preserved vision near the center visual field.  Of note, his history of migraine off and on but no headache.  In the ED, afebrile, heart rate in 60s, blood pressure 189/85, breathing on room air Labs with WC count 12.1, otherwise unremarkable Blood alcohol level not elevated. CT angio of head and neck 1. Negative for large vessel occlusion or intracranial aneurysm. But positive for bulky left carotid  bifurcation plaque with high-grade proximal left ICA stenosis estimated at 80%, approaching a radiographic-string-sign. 2. Moderate stenosis of the left vertebral artery origin related to soft plaque. Mild distal left vertebral artery stenosis. 3. No definite acute intracranial abnormality. By head CT, suspect artifactual hypodensity at the right occipital pole.  Patient was seen by ophthalmologist Dr. Sherryll Burger..  He diagnosed CRAO. Recommended  inpatient management for stroke workup. Neurology and vascular surgery were consulted as well.  Admitted for stroke workup  Subjective: Patient was seen and examined this morning.  Pleasant middle-aged male. Sitting up at the edge of the bed.  Wife at bedside. Vascular surgeon Dr. Lenell Antu was at the bedside at the time of examination. He planned for outpatient left TCAR on Thursday, 02/14/2022.  Hospital course: Left CRAO Presented with left-sided vision loss Evaluated by ophthalmology in the ED. Recommended stroke workup CT angio without large vessel occlusion.  MRI brain did not show any evidence of stroke Admitted for stroke workup Seen by neurology. 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 Planned for outpatient left TCAR on Thursday, 02/14/2022.  Intermittent palpitation Patient tells me that for the last few weeks, he had 1-2 episodes of unprovoked palpitation. No prior history of A-fib. D/w cardio and neuro. Outpatient cardiology monitoring planned. =  Hypertension Blood pressure on admission was significantly elevated close to 200 Not on any antihypertensives at home. Overnight, blood pressure remained elevated mostly 160s to 170s I started him on amlodipine 10 mg daily this morning.  Discussed  with the same.  Continue to monitor blood pressure at home Needs to follow-up with PCP as an outpatient for  further monitoring and adjustment  HLD Crestor  Hypothyroidism Synthroid  Mobility: PT eval  Goals of care:   Code Status: Full Code   Diet:  Diet Order             Diet - low sodium heart healthy           Diet regular Room service appropriate? Yes; Fluid consistency: Thin  Diet effective now                   Nutritional status:  Body mass index is 30.39 kg/m.       Wounds:  -    Discharge Exam:   Vitals:   02/10/23 1944 02/10/23 2349 02/11/23 0354 02/11/23 0749  BP: (!) 160/82 (!) 150/73 (!) 169/84 (!) 167/83  Pulse: 70 72 75 79  Resp: 17 17 18 17   Temp: 98.1 F (36.7 C) 98.4 F (36.9 C) 98.2 F (36.8 C) 97.6 F (36.4 C)  TempSrc: Oral Oral Oral Oral  SpO2: 98% 95% 97% 98%  Weight:      Height:        Body mass index is 30.39 kg/m.   General exam: Pleasant, middle-aged Caucasian male. Skin: No rashes, lesions or ulcers. HEENT: Atraumatic, normocephalic, no obvious bleeding Lungs: Clear to auscultation bilaterally,  CVS: S1, S2, no murmur,   GI/Abd: Soft, nontender, nondistended, bowel sound present,   CNS: Alert, awake, oriented x 3.  Left eye vision loss persists.  No other focal deficit Psychiatry: Mood appropriate,  Extremities: No pedal edema, no calf tenderness,   Follow ups:    Follow-up Information     Kaleen Mask, MD Follow up.   Specialty: Family Medicine Contact information: [address] [place] [address] [phone]         Leonie Douglas, MD Follow up.   Specialties: Vascular Surgery, Interventional Cardiology Contact information: [address] [place] [address] [phone]         [facility]. Schedule an appointment as soon as possible for a visit in 1 month(s).   Specialty: Neurology Why: stroke clinic Contact information: [address] [place] [address] [phone]                Discharge Instructions:    Discharge Instructions     Ambulatory referral to Neurology   Complete by: As directed    Follow up with stroke clinic NP at [facility] in about 4-6 weeks. Thanks.   Call MD for:  difficulty breathing, headache or visual disturbances   Complete by: As directed    Call MD for:  extreme fatigue   Complete by: As directed    Call MD for:  hives   Complete by: As directed    Call MD for:  persistant dizziness or light-headedness   Complete by: As directed    Call MD for:  persistant nausea and vomiting   Complete by: As directed    Call MD for:  severe uncontrolled pain   Complete by: As directed    Call MD for:  temperature >100.4   Complete by: As directed    Diet - low sodium heart healthy   Complete by: As directed    Discharge instructions   Complete by: As directed    Recommendations at discharge:   You have been started  on aspirin, Plavix and statin.  Follow-up with vascular surgery as an outpatient for left TCAR on 02/14/2022  Blood pressure is elevated.  You have been started on amlodipine 10 mg daily.  Continue to monitor blood pressure at home.  Follow-up with PCP as an outpatient for further adjustment.  General discharge instructions: Follow with Primary MD Kaleen Mask, MD in 7 days  Please request your PCP  to go over your hospital tests, procedures, radiology results at the follow up. Please get your medicines reviewed and adjusted.  Your PCP may decide to repeat certain labs or tests as needed. Do not drive, operate heavy machinery, perform activities at heights, swimming or participation in water activities or provide baby sitting services if your were admitted for syncope or siezures until you have seen by Primary MD or a Neurologist and advised to do so again. [place] Controlled Substance Reporting System database was reviewed. Do not drive, operate heavy machinery, perform activities at heights, swim, participate in water activities or provide  baby-sitting services while on medications for pain, sleep and mood until your outpatient physician has reevaluated you and advised to do so again.  You are strongly recommended to comply with the dose, frequency and duration of prescribed medications. Activity: As tolerated with Full fall precautions use walker/cane & assistance as needed Avoid using any recreational substances like cigarette, tobacco, alcohol, or non-prescribed drug. If you experience worsening of your admission symptoms, develop shortness of breath, life threatening emergency, suicidal or homicidal thoughts you must seek medical attention immediately by calling 911 or calling your MD immediately  if symptoms less severe. You must read complete instructions/literature along with all the possible adverse reactions/side effects for all the medicines you take and that have been prescribed to you. Take any new medicine only after you have completely understood and accepted all the possible adverse reactions/side effects.  Wear Seat belts while driving. You were cared for by a hospitalist during your hospital stay. If you have any questions about your discharge medications or the care you received while you were in the hospital after you are discharged, you can call the unit and ask to speak with the hospitalist or the covering physician. Once you are discharged, your primary care physician will handle any further medical issues. Please note that NO REFILLS for any discharge medications will be authorized once you are discharged, as it is imperative that you return to your primary care physician (or establish a relationship with a primary care physician if you do not have one).   Increase activity slowly   Complete by: As directed    Increase activity slowly   Complete by: As directed        Discharge Medications:   Allergies as of 02/11/2023   No Known Allergies      Medication List     TAKE these medications    amLODipine  10 MG tablet Commonly known as: NORVASC Take 1 tablet (10 mg total) by mouth daily. Start taking on: February 12, 2023   aspirin EC 81 MG tablet Take 1 tablet (81 mg total) by mouth daily. Swallow whole. Start taking on: February 12, 2023   clopidogrel 75 MG tablet Commonly known as: PLAVIX Take 1 tablet (75 mg total) by mouth daily. Start taking on: February 12, 2023   levothyroxine 112 MCG tablet Commonly known as: SYNTHROID Take 112 mcg by mouth every morning.   rosuvastatin 20 MG tablet Commonly known as: CRESTOR Take 1 tablet (  20 mg total) by mouth daily. Start taking on: February 12, 2023 What changed:  medication strength how much to take         The results of significant diagnostics from this hospitalization (including imaging, microbiology, ancillary and laboratory) are listed below for reference.    Procedures and Diagnostic Studies:   ECHOCARDIOGRAM COMPLETE BUBBLE STUDY Result Date: 02/11/2023    ECHOCARDIOGRAM REPORT   Patient Name:   [name] Date of Exam: 02/11/2023 Medical Rec #:  [id number]      Height:       78.0 in Accession #:    [id number]     Weight:       263.0 lb Date of Birth:  09-07-1960      BSA:          2.536 m Patient Age:    62 years       BP:           167/83 mmHg Patient Gender: M              HR:           80 bpm. Exam Location:  Inpatient Procedure: 2D Echo, Cardiac Doppler, Color Doppler and Saline Contrast Bubble            Study Indications:    Stroke  History:        Patient has no prior history of Echocardiogram examinations.                 Risk Factors:Hypertension.  Sonographer:    Webb Laws Referring Phys: [id number] CHRISTIAN H PROSPERI IMPRESSIONS  1. Left ventricular ejection fraction, by estimation, is 50 to 55%. The left ventricle has low normal function. The left ventricle has no regional wall motion abnormalities. Left ventricular diastolic parameters are consistent with Grade I diastolic dysfunction (impaired  relaxation).  2. Right ventricular systolic function is normal. The right ventricular size is normal.  3. The mitral valve is normal in structure. No evidence of mitral valve regurgitation. No evidence of mitral stenosis.  4. The aortic valve is normal in structure. There is mild calcification of the aortic valve. Aortic valve regurgitation is not visualized. Aortic valve sclerosis/calcification is present, without any evidence of aortic stenosis.  5. The inferior vena cava is normal in size with greater than 50% respiratory variability, suggesting right atrial pressure of 3 mmHg. FINDINGS  Left Ventricle: Left ventricular ejection fraction, by estimation, is 50 to 55%. The left ventricle has low normal function. The left ventricle has no regional wall motion abnormalities. The left ventricular internal cavity size was normal in size. There is no left ventricular hypertrophy. Left ventricular diastolic parameters are consistent with Grade I diastolic dysfunction (impaired relaxation). Right Ventricle: The right ventricular size is normal. No increase in right ventricular wall thickness. Right ventricular systolic function is normal. Left Atrium: Left atrial size was normal in size. Right Atrium: Right atrial size was normal in size. Pericardium: There is no evidence of pericardial effusion. Presence of epicardial fat layer. Mitral Valve: The mitral valve is normal in structure. No evidence of mitral valve regurgitation. No evidence of mitral valve stenosis. Tricuspid Valve: The tricuspid valve is normal in structure. Tricuspid valve regurgitation is not demonstrated. No evidence of tricuspid stenosis. Aortic Valve: The aortic valve is normal in structure. There is mild calcification of the aortic valve. Aortic valve regurgitation is not visualized. Aortic valve sclerosis/calcification is present, without any evidence of aortic stenosis. Aortic valve peak  gradient measures 17.5 mmHg. Pulmonic Valve: The pulmonic  valve was normal in structure. Pulmonic valve regurgitation is not visualized. No evidence of pulmonic stenosis. Aorta: The aortic root is normal in size and structure. Venous: The inferior vena cava is normal in size with greater than 50% respiratory variability, suggesting right atrial pressure of 3 mmHg. IAS/Shunts: No atrial level shunt detected by color flow Doppler. Agitated saline contrast was given intravenously to evaluate for intracardiac shunting.  LEFT VENTRICLE PLAX 2D LVIDd:         4.90 cm      Diastology LVIDs:         4.30 cm      LV e' medial:    5.77 cm/s LV PW:         0.90 cm      LV E/e' medial:  14.7 LV IVS:        0.80 cm      LV e' lateral:   5.98 cm/s LVOT diam:     2.10 cm      LV E/e' lateral: 14.2 LV SV:         77 LV SV Index:   30 LVOT Area:     3.46 cm  LV Volumes (MOD) LV vol d, MOD A2C: 70.1 ml LV vol d, MOD A4C: 128.0 ml LV vol s, MOD A2C: 35.7 ml LV vol s, MOD A4C: 48.9 ml LV SV MOD A2C:     34.4 ml LV SV MOD A4C:     128.0 ml LV SV MOD BP:      52.5 ml RIGHT VENTRICLE RV Basal diam:  3.90 cm RV S prime:     12.00 cm/s TAPSE (M-mode): 2.7 cm LEFT ATRIUM             Index        RIGHT ATRIUM           Index LA diam:        3.50 cm 1.38 cm/m   RA Area:     15.00 cm LA Vol (A2C):   43.8 ml 17.27 ml/m  RA Volume:   35.00 ml  13.80 ml/m LA Vol (A4C):   48.5 ml 19.12 ml/m LA Biplane Vol: 46.0 ml 18.14 ml/m  AORTIC VALVE AV Area (Vmax): 1.87 cm AV Vmax:        209.00 cm/s AV Peak Grad:   17.5 mmHg LVOT Vmax:      113.00 cm/s LVOT Vmean:     77.500 cm/s LVOT VTI:       0.223 m  AORTA Ao Root diam: 2.70 cm MITRAL VALVE MV Area (PHT): 3.28 cm     SHUNTS MV E velocity: 84.80 cm/s   Systemic VTI:  0.22 m MV A velocity: 117.00 cm/s  Systemic Diam: 2.10 cm MV E/A ratio:  0.72 Kardie Tobb DO Electronically signed by Thomasene Ripple DO Signature Date/Time: 02/11/2023/12:57:49 PM    Final    MR BRAIN WO CONTRAST Result Date: 02/10/2023 CLINICAL DATA:  Neuro deficit, acute, stroke  suspected. Abnormal vision in the left eye. EXAM: MRI HEAD WITHOUT CONTRAST TECHNIQUE: Multiplanar, multiecho pulse sequences of the brain and surrounding structures were obtained without intravenous contrast. COMPARISON:  MR scratched at CT angio head and neck 02/10/2023 FINDINGS: Brain: No acute infarct, hemorrhage, or mass lesion is present. Scattered periventricular and subcortical T2 hyperintensities are mildly advanced for age. The ventricles are of normal size. No significant extraaxial fluid collection is present. Deep brain nuclei are  within normal limits. The brainstem and cerebellum are within normal limits. The internal auditory canals are within normal limits. Midline structures are within normal limits. Vascular: Flow is present in the major intracranial arteries. Skull and upper cervical spine: The craniocervical junction is normal. Upper cervical spine is within normal limits. Marrow signal is unremarkable. Sinuses/Orbits: Fluid level is present within an atelectatic right maxillary sinus. Paranasal sinuses are otherwise clear. Bilateral mastoid effusions are present. No obstructing nasopharyngeal lesion is present. The globes and orbits are within normal limits. IMPRESSION: 1. No acute intracranial abnormality. 2. Scattered periventricular and subcortical T2 hyperintensities are mildly advanced for age. The finding is nonspecific but can be seen in the setting of chronic microvascular ischemia, a demyelinating process such as multiple sclerosis, vasculitis, complicated migraine headaches, or as the sequelae of a prior infectious or inflammatory process. 3. Bilateral mastoid effusions. No obstructing nasopharyngeal lesion is present. 4. Fluid level within an atelectatic right maxillary sinus. This likely reflects acute on chronic sinusitis. Electronically Signed   By: Marin Roberts M.D.   On: 02/10/2023 15:50   CT ANGIO HEAD NECK W WO CM Result Date: 02/10/2023 CLINICAL DATA:  62 year old  male with abnormal left side vision. Neurologic deficit. EXAM: CT ANGIOGRAPHY HEAD AND NECK WITH AND WITHOUT CONTRAST TECHNIQUE: Multidetector CT imaging of the head and neck was performed using the standard protocol during bolus administration of intravenous contrast. Multiplanar CT image reconstructions and MIPs were obtained to evaluate the vascular anatomy. Carotid stenosis measurements (when applicable) are obtained utilizing NASCET criteria, using the distal internal carotid diameter as the denominator. RADIATION DOSE REDUCTION: This exam was performed according to the departmental dose-optimization program which includes automated exposure control, adjustment of the mA and/or kV according to patient size and/or use of iterative reconstruction technique. CONTRAST:  75mL OMNIPAQUE IOHEXOL 350 MG/ML SOLN COMPARISON:  None Available. FINDINGS: CT HEAD Brain: Cerebral volume is within normal limits for age. Asymmetric hypodensity at the pole of the right occipital lobe on series 5 image 11 is questionably artifact (also sagittal image 30 of series 8. No midline shift, ventriculomegaly, mass effect, evidence of mass lesion, intracranial hemorrhage. No other cortically based acute infarction. Calvarium and skull base: Intact. No acute osseous abnormality identified. Paranasal sinuses: Mucoperiosteal thickening and bubbly opacity in the right maxillary sinus. Minor paranasal sinus mucosal thickening otherwise. Tympanic cavities and mastoids are clear. Orbits: Visualized scalp soft tissues are within normal limits. Orbits soft tissues appears symmetric and negative. CTA NECK Skeleton: Absent dentition. Mild for age spine degeneration. No acute osseous abnormality identified. Upper chest: Negative; symmetric dependent pulmonary atelectasis. Other neck: Retained secretions in the nasopharynx, other bilateral neck soft tissue spaces are within normal limits. No cervical lymphadenopathy. Aortic arch: Calcified aortic  atherosclerosis.  3 vessel arch. Right carotid system: Obscured right CCA origin from dense right subclavian venous contrast streak. Proximal to that the brachiocephalic artery demonstrates minimal atherosclerosis. Patent right CCA. Soft and calcified plaque at the right carotid bifurcation, right ICA origin and bulb. But no significant stenosis. Left carotid system: Patent left CCA with soft plaque mostly above the level of the larynx. Bulky soft and calcified plaque at the left ICA origin and bulb with high-grade proximal left ICA stenosis numerically estimated at 80 % with respect to the distal vessel. See series 12, image 150, series 11, image 122). The left ICA remains patent with additional calcified plaque at the bulb. Vertebral arteries: Right subclavian origin also obscured by right subclavian venous contrast streak. Normal  right vertebral artery origin. Patent right vertebral artery to the skull base with no significant plaque or stenosis. Mild proximal left subclavian plaque without stenosis. Soft plaque at the left vertebral artery origin results in moderate stenosis on series 12, image 151. Non dominant left vertebral artery otherwise is patent with no additional plaque or stenosis to the skull base. CTA HEAD Posterior circulation: Dominant right vertebral V4 segment is patent to the basilar without plaque or stenosis. There is left V4 mild plaque and stenosis near the left PICA origin which remains patent. Patent left vertebrobasilar junction. Patent basilar artery. Patent SCA and PCA origins. Posterior communicating arteries are diminutive or absent. Bilateral PCA branches are patent. No right PCA branch occlusion is identified. There is mild left P2 segment irregularity. Anterior circulation: Both ICA siphons are patent. No left siphon plaque or stenosis. The left ophthalmic artery appears to remain patent at the orbital apex on coronal series 12, image 181 although the left ophthalmic artery origin  is difficult to identify. On coronal images the right ophthalmic artery enhancement appears more robust. However, comparing axial images series 9, image 114 the ophthalmic artery enhancement appears symmetric. Overall, No ophthalmic artery abnormality by CTA. Right ICA siphon mild atherosclerosis without stenosis. Patent carotid termini. Normal MCA and ACA origins. Anterior communicating arteries within normal limits. Bilateral ACA branches are within normal limits. Left MCA M1 segment and bifurcation are patent without stenosis. Right MCA M1 segment and bifurcation are patent without stenosis. Bilateral MCA branches are within normal limits. Venous sinuses: Patent. Anatomic variants: Dominant right vertebral artery. Review of the MIP images confirms the above findings IMPRESSION: 1. Negative for large vessel occlusion or intracranial aneurysm. But positive for bulky Left Carotid bifurcation plaque with High-grade proximal Left ICA stenosis estimated at 80%, approaching a radiographic-string-sign. 2. Moderate stenosis of the left vertebral artery origin related to soft plaque. Mild distal left vertebral artery stenosis. 3. No definite acute intracranial abnormality. By head CT, suspect artifactual hypodensity at the right occipital pole. 4.  Aortic Atherosclerosis (ICD10-I70.0). Electronically Signed   By: Odessa Fleming M.D.   On: 02/10/2023 11:47     Labs:   Basic Metabolic Panel: Recent Labs  Lab 02/10/23 1016 02/11/23 0523  NA 137 135  K 4.0 3.7  CL 106 104  CO2 23 20*  GLUCOSE 96 103*  BUN 15 13  CREATININE 1.07 1.02  CALCIUM 9.0 9.5   GFR Estimated Creatinine Clearance: 111.8 mL/min (by C-G formula based on SCr of 1.02 mg/dL). Liver Function Tests: Recent Labs  Lab 02/10/23 1016  AST 18  ALT 16  ALKPHOS 88  BILITOT 0.7  PROT 7.1  ALBUMIN 3.9   No results for input(s): "LIPASE", "AMYLASE" in the last 168 hours. No results for input(s): "AMMONIA" in the last 168 hours. Coagulation  profile Recent Labs  Lab 02/10/23 1016  INR 1.0    CBC: Recent Labs  Lab 02/10/23 1016 02/11/23 0523  WBC 12.1* 11.3*  NEUTROABS 9.0*  --   HGB 14.3 15.0  HCT 42.4 44.7  MCV 89.5 88.2  PLT 207 214   Cardiac Enzymes: No results for input(s): "CKTOTAL", "CKMB", "CKMBINDEX", "TROPONINI" in the last 168 hours. BNP: Invalid input(s): "POCBNP" CBG: No results for input(s): "GLUCAP" in the last 168 hours. D-Dimer No results for input(s): "DDIMER" in the last 72 hours. Hgb A1c No results for input(s): "HGBA1C" in the last 72 hours. Lipid Profile Recent Labs    02/11/23 0523  CHOL 166  HDL  32*  LDLCALC 92  TRIG 211*  CHOLHDL 5.2   Thyroid function studies No results for input(s): "TSH", "T4TOTAL", "T3FREE", "THYROIDAB" in the last 72 hours.  Invalid input(s): "FREET3" Anemia work up No results for input(s): "VITAMINB12", "FOLATE", "FERRITIN", "TIBC", "IRON", "RETICCTPCT" in the last 72 hours. Microbiology No results found for this or any previous visit (from the past 240 hours).  Time coordinating discharge: 45 minutes  Signed: Danyeal Akens  [facility] 02/11/2023, 3:32 PM

## 2023-02-11 NOTE — Plan of Care (Signed)
  Problem: Education: Goal: Knowledge of disease or condition will improve Outcome: Adequate for Discharge Goal: Knowledge of secondary prevention will improve (MUST DOCUMENT ALL) Outcome: Adequate for Discharge Goal: Knowledge of patient specific risk factors will improve Loraine Leriche N/A or DELETE if not current risk factor) Outcome: Adequate for Discharge   Problem: Ischemic Stroke/TIA Tissue Perfusion: Goal: Complications of ischemic stroke/TIA will be minimized Outcome: Adequate for Discharge   Problem: Coping: Goal: Will verbalize positive feelings about self Outcome: Adequate for Discharge Goal: Will identify appropriate support needs Outcome: Adequate for Discharge   Problem: Health Behavior/Discharge Planning: Goal: Ability to manage health-related needs will improve Outcome: Adequate for Discharge Goal: Goals will be collaboratively established with patient/family Outcome: Adequate for Discharge   Problem: Self-Care: Goal: Ability to participate in self-care as condition permits will improve Outcome: Adequate for Discharge Goal: Verbalization of feelings and concerns over difficulty with self-care will improve Outcome: Adequate for Discharge Goal: Ability to communicate needs accurately will improve Outcome: Adequate for Discharge   Problem: Nutrition: Goal: Risk of aspiration will decrease Outcome: Adequate for Discharge Goal: Dietary intake will improve Outcome: Adequate for Discharge   Problem: Education: Goal: Knowledge of General Education information will improve Description: Including pain rating scale, medication(s)/side effects and non-pharmacologic comfort measures Outcome: Adequate for Discharge   Problem: Health Behavior/Discharge Planning: Goal: Ability to manage health-related needs will improve Outcome: Adequate for Discharge   Problem: Clinical Measurements: Goal: Ability to maintain clinical measurements within normal limits will improve Outcome:  Adequate for Discharge Goal: Will remain free from infection Outcome: Adequate for Discharge Goal: Diagnostic test results will improve Outcome: Adequate for Discharge Goal: Respiratory complications will improve Outcome: Adequate for Discharge Goal: Cardiovascular complication will be avoided Outcome: Adequate for Discharge   Problem: Activity: Goal: Risk for activity intolerance will decrease Outcome: Adequate for Discharge   Problem: Nutrition: Goal: Adequate nutrition will be maintained Outcome: Adequate for Discharge   Problem: Coping: Goal: Level of anxiety will decrease Outcome: Adequate for Discharge   Problem: Elimination: Goal: Will not experience complications related to bowel motility Outcome: Adequate for Discharge Goal: Will not experience complications related to urinary retention Outcome: Adequate for Discharge   Problem: Pain Management: Goal: General experience of comfort will improve Outcome: Adequate for Discharge   Problem: Safety: Goal: Ability to remain free from injury will improve Outcome: Adequate for Discharge   Problem: Skin Integrity: Goal: Risk for impaired skin integrity will decrease Outcome: Adequate for Discharge

## 2023-02-11 NOTE — Progress Notes (Signed)
Pt is in a calm stable condition. All monitors and IVs have been removed. Pt is discharged home with wife.

## 2023-02-11 NOTE — Progress Notes (Signed)
PT Cancellation Note  Patient Details Name: [name] MRN: [id number] DOB: 1960/12/29   Cancelled Treatment:    Reason Eval/Treat Not Completed: OT screened, no needs identified, will sign off  Harrel Carina, DPT, CLT  Acute Rehabilitation Services Office: [phone] (Secure chat preferred)   Claudia Desanctis 02/11/2023, 9:19 AM

## 2023-02-12 ENCOUNTER — Other Ambulatory Visit: Payer: Self-pay

## 2023-02-12 ENCOUNTER — Ambulatory Visit: Payer: BLUE CROSS/BLUE SHIELD | Attending: Physician Assistant

## 2023-02-12 ENCOUNTER — Encounter: Payer: Self-pay | Admitting: *Deleted

## 2023-02-12 ENCOUNTER — Other Ambulatory Visit (HOSPITAL_COMMUNITY): Payer: Self-pay | Admitting: Physician Assistant

## 2023-02-12 ENCOUNTER — Telehealth: Payer: Self-pay

## 2023-02-12 DIAGNOSIS — R002 Palpitations: Secondary | ICD-10-CM

## 2023-02-12 DIAGNOSIS — H3412 Central retinal artery occlusion, left eye: Secondary | ICD-10-CM

## 2023-02-12 DIAGNOSIS — I6522 Occlusion and stenosis of left carotid artery: Secondary | ICD-10-CM

## 2023-02-12 LAB — POCT I-STAT, CHEM 8
BUN: 17 mg/dL (ref 6–20)
Calcium, Ion: 1.1 mmol/L — ABNORMAL LOW (ref 1.15–1.40)
Chloride: 106 mmol/L (ref 98–111)
Creatinine, Ser: 1 mg/dL (ref 0.61–1.24)
Glucose, Bld: 93 mg/dL (ref 70–99)
HCT: 40 % (ref 39.0–52.0)
Hemoglobin: 13.6 g/dL (ref 13.0–17.0)
Potassium: 3.9 mmol/L (ref 3.5–5.1)
Sodium: 138 mmol/L (ref 135–145)
TCO2: 22 mmol/L (ref 22–32)

## 2023-02-12 LAB — HEMOGLOBIN A1C
Hgb A1c MFr Bld: 5.7 % — ABNORMAL HIGH (ref 4.8–5.6)
Mean Plasma Glucose: 117 mg/dL

## 2023-02-12 LAB — HIV ANTIBODY (ROUTINE TESTING W REFLEX): HIV Screen 4th Generation wRfx: NONREACTIVE

## 2023-02-12 NOTE — Telephone Encounter (Signed)
I called patient to verify his insurance for 2025.  He is scheduled for a L TCAR on 02/15/23.  I submitted an auth request with [employer] but also called since pt has a Animal nutritionist.  Per Olena Heckle., plan is out of network with our physicians and hospital unless it is on an emergency basis. Pt is aware and verbalized understanding that he is to contact his insurance provider to find a vascular surgeon who is in network with his plan. [phone]).

## 2023-02-12 NOTE — Addendum Note (Signed)
Addended by: Primitivo Gauze on: 02/12/2023 03:01 PM   Modules accepted: Orders

## 2023-02-12 NOTE — Addendum Note (Signed)
Addended by: Primitivo Gauze on: 02/12/2023 02:05 PM   Modules accepted: Orders

## 2023-02-12 NOTE — Progress Notes (Unsigned)
Enrolled for [employer] to mail a ZIO XT long term holter monitor to the patients address on file.   Letter with instructions mailed to patient.  Dr. Harriet Masson to read.

## 2023-02-15 ENCOUNTER — Inpatient Hospital Stay: Admit: 2023-02-15 | Payer: Self-pay | Admitting: Vascular Surgery

## 2023-02-15 SURGERY — TRANSCAROTID ARTERY REVASCULARIZATION (TCAR)
Anesthesia: General | Laterality: Left

## 2023-04-18 NOTE — Progress Notes (Signed)
 Dr. Servando Salina and Leavy Cella,  [name] has been identified as a patient that may benefit from health coaching to develop healthy eating habits to improve his cholesterol based on his 02/22/23 lipid panel showing elevated triglycerides and low HDL. Please refer to [id number] or Care Navigation.   Renaee Munda, MS, ERHD, [facility]  Care Guide, Health & Wellness Coach [address] [place] [address] Telephone: [phone] Email: [email or web address]

## 2023-04-18 NOTE — Telephone Encounter (Signed)
 error

## 2023-04-19 ENCOUNTER — Ambulatory Visit: Payer: BLUE CROSS/BLUE SHIELD | Attending: Cardiology | Admitting: Cardiology

## 2023-04-30 ENCOUNTER — Emergency Department (HOSPITAL_COMMUNITY)

## 2023-04-30 ENCOUNTER — Emergency Department (HOSPITAL_COMMUNITY)
Admission: EM | Admit: 2023-04-30 | Discharge: 2023-04-30 | Disposition: A | Discharge status: Home / Self Care | Attending: Emergency Medicine | Admitting: Emergency Medicine

## 2023-04-30 DIAGNOSIS — W01198A Fall on same level from slipping, tripping and stumbling with subsequent striking against other object, initial encounter: Secondary | ICD-10-CM | POA: Insufficient documentation

## 2023-04-30 DIAGNOSIS — Y92481 Parking lot as the place of occurrence of the external cause: Secondary | ICD-10-CM | POA: Diagnosis not present

## 2023-04-30 DIAGNOSIS — S0511XA Contusion of eyeball and orbital tissues, right eye, initial encounter: Secondary | ICD-10-CM | POA: Diagnosis not present

## 2023-04-30 DIAGNOSIS — W19XXXA Unspecified fall, initial encounter: Secondary | ICD-10-CM

## 2023-04-30 DIAGNOSIS — Z7982 Long term (current) use of aspirin: Secondary | ICD-10-CM | POA: Diagnosis not present

## 2023-04-30 DIAGNOSIS — S0083XA Contusion of other part of head, initial encounter: Secondary | ICD-10-CM

## 2023-04-30 DIAGNOSIS — S0993XA Unspecified injury of face, initial encounter: Secondary | ICD-10-CM | POA: Diagnosis present

## 2023-04-30 MED ORDER — ACETAMINOPHEN 500 MG PO TABS
1000.0000 mg | ORAL_TABLET | Freq: Once | ORAL | Status: AC
Start: 2023-04-30 — End: 2023-04-30
  Administered 2023-04-30: 1000 mg via ORAL
  Filled 2023-04-30: qty 2

## 2023-04-30 NOTE — ED Provider Notes (Signed)
 [place] EMERGENCY DEPARTMENT AT [facility] Provider Note   CSN: [id number] Arrival date & time: 04/30/23  1300     History  Chief Complaint  Patient presents with   [name] is a 63 y.o. male.   Fall     63 year old male brought in by EMS from [facility] after complaint of ground-level mechanical fall.  The patient tripped in the parking lot and struck the right side of his face.  He sustained a hematoma lateral to the right eye.  He denies any new vision loss, denies any new focal neurologic deficits.  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  Denies loss of consciousness, is not on anticoagulation, denies any neck pain or trauma.  He arrives GCS 15, ABC intact  Home Medications Prior to Admission medications   Medication Sig Start Date End Date Taking? Authorizing Provider  amLODipine (NORVASC) 10 MG tablet Take 1 tablet (10 mg total) by mouth daily. 02/12/23 03/14/23  Lorin Glass, MD  aspirin EC 81 MG tablet Take 1 tablet (81 mg total) by mouth daily. Swallow whole. 02/12/23   Lorin Glass, MD  levothyroxine (SYNTHROID) 112 MCG tablet Take 112 mcg by mouth every morning. 12/22/22   [provider]  rosuvastatin (CRESTOR) 20 MG tablet Take 1 tablet (20 mg total) by mouth daily. 02/12/23 03/14/23  Lorin Glass, MD      Allergies    Patient has no known allergies.    Review of Systems   Review of Systems  All other systems reviewed and are negative.   Physical Exam Updated Vital Signs BP 133/81   Pulse 61   Temp 98 F (36.7 C) (Oral)   Resp 15   SpO2 100%  Physical Exam Vitals and nursing note reviewed.  Constitutional:      Appearance: He is well-developed.     Comments: GCS 15, ABC intact  HENT:     Head: Normocephalic.     Comments: Periorbital hematoma on the right Eyes:     Conjunctiva/sclera: Conjunctivae normal.  Neck:     Comments: No midline tenderness to  palpation of the cervical spine. ROM intact. Cardiovascular:     Rate and Rhythm: Normal rate and regular rhythm.     Heart sounds: No murmur heard. Pulmonary:     Effort: Pulmonary effort is normal. No respiratory distress.     Breath sounds: Normal breath sounds.  Chest:     Comments: Chest wall stable and non-tender to AP and lateral compression. Clavicles stable and non-tender to AP compression Abdominal:     Palpations: Abdomen is soft.     Tenderness: There is no abdominal tenderness.     Comments: Pelvis stable to lateral compression.  Musculoskeletal:     Cervical back: Neck supple.     Comments: No midline tenderness to palpation of the thoracic or lumbar spine. Extremities atraumatic with intact ROM.   Skin:    General: Skin is warm and dry.  Neurological:     Mental Status: He is alert.     Comments: CN II-XII grossly intact. Moving all four extremities spontaneously and sensation grossly intact.     ED Results / Procedures / Treatments   Labs (all labs ordered are listed, but only abnormal results are displayed) Labs Reviewed - No data to display  EKG EKG Interpretation Date/Time:  Monday April 30 2023 13:08:48 EDT Ventricular Rate:  67 PR Interval:  160 QRS Duration:  150 QT Interval:  460 QTC Calculation: 486 R Axis:   2  Text Interpretation: Sinus rhythm Right bundle branch block No previous ECGs available Confirmed by Ernie Avena ([id number]) on 04/30/2023 2:13:52 PM  Radiology CT Maxillofacial Wo Contrast Result Date: 04/30/2023 CLINICAL DATA:  Provided history: Facial trauma, blunt. Additional history obtained from electronic MEDICAL RECORD NUMBERFall (with facial trauma), right periorbital ecchymosis. EXAM: CT MAXILLOFACIAL WITHOUT CONTRAST TECHNIQUE: Multidetector CT imaging of the maxillofacial structures was performed. Multiplanar CT image reconstructions were also generated. RADIATION DOSE REDUCTION: This exam was performed according to the departmental  dose-optimization program which includes automated exposure control, adjustment of the mA and/or kV according to patient size and/or use of iterative reconstruction technique. COMPARISON:  Brain MRI 02/10/2023. Non-contrast head CT and CT angiogram head/neck 02/10/2023. FINDINGS: Osseous: No acute orbital or maxillofacial fracture is identified. The patient is edentulous. Orbits: Right periorbital hematoma. No acute finding within the orbits. Sinuses: Moderate right maxillary sinusitis (with fluid level, mucosal thickening and sinus atelectasis). Minimal mucosal thickening within the bilateral ethmoid and frontal sinuses. Soft tissues: Right periorbital hematoma. Limited intracranial: No evidence of an acute intracranial abnormality within the field of view. Other: Nasal septal defect measuring 3.2 cm in AP dimension. Trace fluid within bilateral mastoid air cells. Left carotid artery stent. IMPRESSION: 1. No evidence of acute orbital or maxillofacial fracture. 2. Right periorbital hematoma. 3. Moderate right maxillary sinusitis (with sinus atelectasis). 4. Nasal septal defect. 5.  Trace fluid within bilateral mastoid air cells. Electronically Signed   By: Jackey Loge D.O.   On: 04/30/2023 16:07    Procedures Procedures    Medications Ordered in ED Medications  acetaminophen (TYLENOL) tablet 1,000 mg (1,000 mg Oral Given 04/30/23 1444)    ED Course/ Medical Decision Making/ A&P                                 Medical Decision Making Amount and/or Complexity of Data Reviewed Radiology: ordered.  Risk OTC drugs.    63 year old male brought in by EMS from [facility] [facility] after complaint of ground-level mechanical fall.  The patient tripped in the parking lot and struck the right side of his face.  He sustained a hematoma lateral to the right eye.  He denies any new vision loss, denies any new focal neurologic deficits.  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  Denies loss of consciousness, is not on anticoagulation, denies any neck pain or trauma.  He arrives GCS 15, ABC intact  On arrival, the patient was vitally stable, physical exam revealed a periorbital hematoma on the right.  Patient C-spine cleared by Nexus criteria.  Head imaging determined unnecessary based on [place] head CT imaging criteria.  Will obtain CT max face to evaluate for possible facial fracture.  Patient provided with Tylenol for pain control.  CT face pending at time of signout, signout given to Dr. Posey Rea at 1500.    Final Clinical Impression(s) / ED Diagnoses Final diagnoses:  Fall, initial encounter  Contusion of face, initial encounter    Rx / DC Orders ED Discharge Orders     None         Ernie Avena, MD 04/30/23 [phone]

## 2023-04-30 NOTE — ED Triage Notes (Signed)
 Pt BIB EMS from [facility] with c/o fall after tripping in the parking lot. Pt hit face, hematoma to R eye. Pt denies currently being on blood thinners, but did stop taking one about a month ago, does not recall the name but was on it for previous strokes. At baseline blind in L eye from previous CVA. Can see out of R eye. Denies LOC, denies neck pain.

## 2023-05-01 NOTE — ED Provider Notes (Signed)
  Physical Exam  BP 133/81   Pulse 61   Temp 98 F (36.7 C) (Oral)   Resp 15   SpO2 100%   Physical Exam Vitals and nursing note reviewed.  Constitutional:      General: He is not in acute distress.    Appearance: He is well-developed.  HENT:     Head: Normocephalic.  Eyes:     Conjunctiva/sclera: Conjunctivae normal.  Cardiovascular:     Rate and Rhythm: Normal rate and regular rhythm.     Heart sounds: No murmur heard. Pulmonary:     Effort: Pulmonary effort is normal. No respiratory distress.  Musculoskeletal:        General: No swelling.     Cervical back: Neck supple.  Skin:    General: Skin is warm and dry.  Neurological:     Mental Status: He is alert.  Psychiatric:        Mood and Affect: Mood normal.     Procedures  Procedures  ED Course / MDM    Medical Decision Making Amount and/or Complexity of Data Reviewed Radiology: ordered.  Risk OTC drugs.   Patient received in handoff.  Facial trauma pending CT max face.  CT max face without evidence of fracture.  Patient discharged with outpatient follow-up       Glendora Score, MD 05/01/23 1132
